# Patient Record
Sex: Female | Born: 1975 | Race: White | Hispanic: No | Smoking: Never smoker
Health system: Southern US, Community
[De-identification: ages and names within clinical notes are randomized; demographics above are authoritative.]

## PROBLEM LIST (undated history)

## (undated) DIAGNOSIS — F329 Major depressive disorder, single episode, unspecified: Secondary | ICD-10-CM

## (undated) DIAGNOSIS — Z8489 Family history of other specified conditions: Secondary | ICD-10-CM

## (undated) DIAGNOSIS — F32A Depression, unspecified: Secondary | ICD-10-CM

## (undated) DIAGNOSIS — F419 Anxiety disorder, unspecified: Secondary | ICD-10-CM

## (undated) DIAGNOSIS — K5792 Diverticulitis of intestine, part unspecified, without perforation or abscess without bleeding: Secondary | ICD-10-CM

## (undated) DIAGNOSIS — Z789 Other specified health status: Secondary | ICD-10-CM

## (undated) HISTORY — PX: OTHER SURGICAL HISTORY: SHX169

## (undated) HISTORY — DX: Other specified health status: Z78.9

---

## 2004-03-13 HISTORY — PX: BREAST ENHANCEMENT SURGERY: SHX7

## 2005-05-19 ENCOUNTER — Other Ambulatory Visit: Admission: RE | Admit: 2005-05-19 | Discharge: 2005-05-19 | Payer: Self-pay | Admitting: Family Medicine

## 2007-03-13 ENCOUNTER — Other Ambulatory Visit: Admission: RE | Admit: 2007-03-13 | Discharge: 2007-03-13 | Payer: Self-pay | Admitting: Family Medicine

## 2008-03-18 ENCOUNTER — Other Ambulatory Visit: Admission: RE | Admit: 2008-03-18 | Discharge: 2008-03-18 | Payer: Self-pay | Admitting: Family Medicine

## 2008-08-24 ENCOUNTER — Emergency Department (HOSPITAL_BASED_OUTPATIENT_CLINIC_OR_DEPARTMENT_OTHER): Admission: EM | Admit: 2008-08-24 | Discharge: 2008-08-24 | Payer: Self-pay | Admitting: Emergency Medicine

## 2008-08-27 ENCOUNTER — Encounter: Admission: RE | Admit: 2008-08-27 | Discharge: 2008-08-27 | Payer: Self-pay | Admitting: Family Medicine

## 2009-05-17 ENCOUNTER — Other Ambulatory Visit: Admission: RE | Admit: 2009-05-17 | Discharge: 2009-05-17 | Payer: Self-pay | Admitting: Family Medicine

## 2010-06-20 LAB — URINALYSIS, ROUTINE W REFLEX MICROSCOPIC
Bilirubin Urine: NEGATIVE
Glucose, UA: NEGATIVE mg/dL
Ketones, ur: 15 mg/dL — AB
pH: 6 (ref 5.0–8.0)

## 2010-06-20 LAB — URINE CULTURE: Colony Count: >=100000

## 2010-06-20 LAB — URINE MICROSCOPIC-ADD ON

## 2010-11-21 ENCOUNTER — Ambulatory Visit (INDEPENDENT_AMBULATORY_CARE_PROVIDER_SITE_OTHER): Payer: BC Managed Care – PPO | Admitting: *Deleted

## 2010-11-21 ENCOUNTER — Encounter: Payer: Self-pay | Admitting: *Deleted

## 2010-11-21 VITALS — BP 119/76 | Wt 140.0 lb

## 2010-11-21 DIAGNOSIS — Z3480 Encounter for supervision of other normal pregnancy, unspecified trimester: Secondary | ICD-10-CM

## 2010-11-21 DIAGNOSIS — O3680X0 Pregnancy with inconclusive fetal viability, not applicable or unspecified: Secondary | ICD-10-CM

## 2010-11-21 NOTE — Progress Notes (Signed)
Patient weened off of Zoloft 25mg , has not taken in two weeks on advice from her primary care.  Positive fetal heart rate seen on ultrasound, will set up ultrasound at [facility] to confirm her dating.  Information given about first trimester screening and patient will talk with her husband to see if this is something they would like to do.  She would like to have a v-bac although she has had two previous c-sections.  She would like her ultrasound appointment around 1:00 if at all possible.

## 2010-11-22 LAB — HIV ANTIBODY (ROUTINE TESTING W REFLEX): HIV: NONREACTIVE

## 2010-11-23 LAB — OBSTETRIC PANEL
Antibody Screen: NEGATIVE
Basophils Absolute: 0 10*3/uL (ref 0.0–0.1)
Eosinophils Relative: 0 % (ref 0–5)
HCT: 42.2 % (ref 36.0–46.0)
Lymphocytes Relative: 24 % (ref 12–46)
Lymphs Abs: 1.6 10*3/uL (ref 0.7–4.0)
MCH: 30.3 pg (ref 26.0–34.0)
MCV: 92.1 fL (ref 78.0–100.0)
Monocytes Absolute: 0.4 10*3/uL (ref 0.1–1.0)
RDW: 14.4 % (ref 11.5–15.5)
Rubella: 411 IU/mL — ABNORMAL HIGH
WBC: 6.8 10*3/uL (ref 4.0–10.5)

## 2010-11-28 ENCOUNTER — Ambulatory Visit (HOSPITAL_COMMUNITY)
Admission: RE | Admit: 2010-11-28 | Discharge: 2010-11-28 | Disposition: A | Discharge status: Home / Self Care | Payer: BC Managed Care – PPO | Source: Ambulatory Visit | Attending: Family Medicine | Admitting: Family Medicine

## 2010-11-28 DIAGNOSIS — O3680X0 Pregnancy with inconclusive fetal viability, not applicable or unspecified: Secondary | ICD-10-CM

## 2010-11-28 DIAGNOSIS — Z3689 Encounter for other specified antenatal screening: Secondary | ICD-10-CM | POA: Insufficient documentation

## 2010-11-28 DIAGNOSIS — O09529 Supervision of elderly multigravida, unspecified trimester: Secondary | ICD-10-CM | POA: Insufficient documentation

## 2010-11-28 DIAGNOSIS — O34219 Maternal care for unspecified type scar from previous cesarean delivery: Secondary | ICD-10-CM | POA: Insufficient documentation

## 2010-12-06 ENCOUNTER — Encounter: Payer: Self-pay | Admitting: Family Medicine

## 2010-12-13 ENCOUNTER — Encounter: Payer: Self-pay | Admitting: Family Medicine

## 2010-12-13 ENCOUNTER — Ambulatory Visit (INDEPENDENT_AMBULATORY_CARE_PROVIDER_SITE_OTHER): Payer: BC Managed Care – PPO | Admitting: Family Medicine

## 2010-12-13 DIAGNOSIS — Z3480 Encounter for supervision of other normal pregnancy, unspecified trimester: Secondary | ICD-10-CM

## 2010-12-13 DIAGNOSIS — O09529 Supervision of elderly multigravida, unspecified trimester: Secondary | ICD-10-CM

## 2010-12-13 DIAGNOSIS — Z113 Encounter for screening for infections with a predominantly sexual mode of transmission: Secondary | ICD-10-CM

## 2010-12-13 DIAGNOSIS — O360990 Maternal care for other rhesus isoimmunization, unspecified trimester, not applicable or unspecified: Secondary | ICD-10-CM

## 2010-12-13 DIAGNOSIS — Z1272 Encounter for screening for malignant neoplasm of vagina: Secondary | ICD-10-CM

## 2010-12-13 DIAGNOSIS — O34219 Maternal care for unspecified type scar from previous cesarean delivery: Secondary | ICD-10-CM | POA: Insufficient documentation

## 2010-12-13 NOTE — Progress Notes (Signed)
   Subjective:    Renee Hood is a [id number] [redacted]w[redacted]d being seen today for her first obstetrical visit.  Her obstetrical history is significant for advanced maternal age. Patient is unsure if she intend to breast feed. Pregnancy history fully reviewed.  Patient reports no complaints.  Filed Vitals:   12/13/10 1300  BP: 112/73  Weight: 142 lb (64.411 kg)    HISTORY: OB History    Grav Para Term Preterm Abortions TAB SAB Ect Mult Living   4 2 2  0 1 0 1 0 0 2     # Outc Date GA Lbr Len/2nd Wgt Sex Del Anes PTL Lv   1 TRM 3/01 [redacted]w[redacted]d  7lb14oz(3.572kg) M LTCS Spinal No Yes   Comments: failed induction after 24 hours labor emergency c-section   2 TRM 7/02 [redacted]w[redacted]d  7lb15oz(3.6kg) F LTCS Spinal No Yes   3 SAB            4 CUR              Past Medical History  Diagnosis Date  . No pertinent past medical history    Past Surgical History  Procedure Date  . Cesarean section   . Dislocated hip 21 months    surgery to repair/tn  . Breast enhancement surgery 2006   Family History  Problem Relation Age of Onset  . Diabetes Maternal Grandmother   . Cancer Paternal Grandmother     colon cancer/under age 84     Exam    Uterine Size: size equals dates  Pelvic Exam:    Perineum: No Hemorrhoids   Vulva: normal   Vagina:  normal mucosa, normal discharge       Cervix: retroverted and non-tender   Adnexa: normal adnexa   Bony Pelvis: gynecoid  System:     Skin: normal coloration and turgor, no rashes    Neurologic: oriented   Extremities: normal strength, tone, and muscle mass   HEENT sclera clear, anicteric   Mouth/Teeth mucous membranes moist, pharynx normal without lesions   Neck supple   Cardiovascular: regular rate and rhythm   Respiratory:  appears well, vitals normal, no respiratory distress, acyanotic, normal RR, ear and throat exam is normal, neck free of mass or lymphadenopathy, chest clear, no wheezing, crepitations, rhonchi, normal symmetric air entry   Abdomen: soft,  non-tender; bowel sounds normal; no masses,  no organomegaly   Urinary: urethral meatus normal      Assessment:    Pregnancy: [id number] Patient Active Problem List  Diagnoses  . Previous cesarean delivery, antepartum condition or complication  . Rh negative, antepartum  . AMA (advanced maternal age) multigravida 35+        Plan:     Initial labs drawn. Prenatal vitamins. Problem list reviewed and updated. Genetic Screening discussed Amniocentesis: undecided.  Ultrasound discussed; fetal survey: later.  Follow up in 4 weeks. Discussed TOLAC at length. Asja Frommer S 12/13/2010

## 2010-12-13 NOTE — Patient Instructions (Addendum)
Amniocentesis Amniocentesis is the removal of fluid from the amniotic sac. This is the sac of fluid that surrounds and protects the baby while it is in the uterus. Once the fluid is removed, your caregiver can have the fluid studied. These studies help find problems the fetus may be having before delivery. Amniocentesis may be done for other reasons, including:  To determine any genetic problems.   To look for signs of infection in the uterus.   To determine if the baby's lungs are mature enough for the baby to survive outside of the uterus. This information is important in pregnancies where the fetus must be delivered early.   To determine the sex of the baby.   If you are pregnant and 65 years old or older. This is because of the increased risk of chromosome abnormalities.   To evaluate the progress of the babies in a multiple pregnancy (twins or more).  BEFORE THE PROCEDURE You should arrive 15 before your procedure, or as suggested by your caregiver. Check-in at the admissions desk to fill out necessary forms, if you are not preregistered. There will be consent forms to sign before the procedure. There is a waiting area for your family. LET YOUR CAREGIVER KNOW ABOUT THE FOLLOWING:  Allergies.  Medications taken, including herbs, eyedrops, over-the-counter medications, and creams.   Use of steroids (by mouth or creams).   Previous problems with numbing medicines.   History of blood clots.  History of bleeding or blood problems.   Previous surgery.   Other health problems.   RISKS AND COMPLICATIONS There is a 0.7% fetal loss (death) rate from complications, when done between 15 and 20 weeks of pregnancy or later. Amniocentesis should not be performed before 15 weeks of pregnancy unless it is absolutely necessary. Removal of the amniotic fluid is often done using ultrasound guidance, to decrease risks to the baby. Complications include:  Bleeding.  Infection.   Leaking of  amniotic fluid.   Premature labor.  Fetal injury.   Injury to the placenta.   This procedure is done only if your caregiver feels the information obtained from the procedure justifies the risk. If there is information about this procedure you do not understand, discuss this with your caregiver. AFTER THE PROCEDURE  You will rest for 1 to 2 hours for observation, at the place where the amniocentesis was done.   The baby will be placed on a monitor for 1 to 2 hours for observation, to see if there are any problems.   You should have someone available to take you home and be with your for that day.  FINDING OUT THE RESULTS OF YOUR TEST Not all test results are available during your visit. If your test results are not back during the visit, make an appointment with your caregiver to find out the results. Do not assume everything is normal if you have not heard from your caregiver or the medical facility. It is important for you to follow up on all of your test results.  HOME CARE INSTRUCTIONS  Take any medication your caregiver gives you.   Rest and eliminate all activities for that day.   Eat your regular diet.   Drink lots of fluids.   Avoid sexual intercourse until your caregiver says it is okay.   Do not do any lifting for the rest of the day.  SEEK MEDICAL CARE IF:  You develop any vaginal bleeding.   You develop leakage of fluid from the vagina.  You develop pain in the belly (abdomen).   You develop uterine contractions.   You have an oral temperature above 101.   You do not feel the baby moving as much as it usually does.  Document Released: 02/25/2000 Document Re-Released: 08/17/2009 [facility] Patient Information 2011 [place], [place]. Pregnancy - First Trimester During sexual intercourse, millions of sperm go into the vagina. Only 1 sperm will penetrate and fertilize the female egg while it is in the Fallopian tube. One week later, the fertilized egg implants into  the wall of the uterus. An embryo begins to develop into a baby. At 6 to 8 weeks, the eyes and face are formed and the heartbeat can be seen on ultrasound. At the end of 12 weeks (first trimester), all the baby's organs are formed. Now that you are pregnant, you will want to do everything you can to have a healthy baby. Two of the most important things are to get good prenatal care and follow your caregiver's instructions. Prenatal care is all the medical care you receive before the baby's birth. It is given to prevent, find and treat problems during the pregnancy and childbirth. PRENATAL EXAMS:  During prenatal visits, your weight, blood pressure and urine are checked. This is done to make sure you are healthy and progressing normally during the pregnancy.   A pregnant woman should gain 25 to 35 pounds during the pregnancy. However, if you are over weight or underweight, your caregiver will advise you regarding your weight.   Your caregiver will ask and answer questions for you.   Blood work, cervical cultures, other necessary tests and a Pap test are done during your prenatal exams. These tests are done to check on your health and the probable health of your baby. Tests are strongly recommended and done for HIV with your permission. This is the virus that causes AIDS. These tests are done because medications can be given to help prevent your baby from being born with this infection should you have been infected without knowing it. Blood work is also used to find out your blood type, previous infections and follow your blood levels (hemoglobin).   Low hemoglobin (anemia) is common during pregnancy. Iron and vitamins are given to help prevent this. Later in the pregnancy, blood tests for diabetes will be done along with any other tests if any problems develop. You may need tests to make sure you and the baby are doing well.   You may need other tests to make sure you and the baby are doing well.    CHANGES DURING THE FIRST TRIMESTER (THE FIRST 3 MONTHS OF PREGNANCY) Your body goes through many changes during pregnancy. They vary from person to person. Talk to your caregiver about changes you notice and are concerned about. Changes can include:  Your menstrual period stops.   The egg and sperm carry the genes that determine what you look like. Genes from you and your partner are forming a baby. The female genes determine whether the baby is a boy or a girl.   Your body increases in girth and you may feel bloated.   Feeling sick to your stomach (nauseous) and throwing up (vomiting). If the vomiting is uncontrollable, call your caregiver.   Your breasts will begin to enlarge and become tender.   Your nipples may stick out more and become darker.   The need to urinate more. Painful urination may mean you have a bladder infection.   Tiring easily.   Loss  of appetite.   Cravings for certain kinds of food.   At first, you may gain or lose a couple of pounds.   You may have changes in your emotions from day to day (excited to be pregnant or concerned something may go wrong with the pregnancy and baby).   You may have more vivid and strange dreams.  HOME CARE INSTRUCTIONS  It is very important to avoid all smoking, alcohol and un-prescribed drugs during your pregnancy. These affect the formation and growth of the baby. Avoid chemicals while pregnant to ensure the delivery of a healthy infant.   Start your prenatal visits by the 12th week of pregnancy. They are usually scheduled monthly at first, then more often in the last 2 months before delivery. Keep your caregiver's appointments. Follow your caregiver's instructions regarding medication use, blood and lab tests, exercise, and diet.   During pregnancy, you are providing food for you and your baby. Eat regular, well-balanced meals. Choose foods such as meat, fish, milk and other low fat dairy products, vegetables, fruits, and  whole-grain breads and cereals. Your caregiver will tell you of the ideal weight gain.   You can help morning sickness by keeping soda crackers (saltines) at the bedside. Eat a couple before arising in the morning. You may want to use the crackers without salt on them.   Eating 4 to 5 small meals rather than 3 large meals a day also may help the nausea and vomiting.   Drinking liquids between meals instead of during meals also seems to help nausea and vomiting.   A physical sexual relationship may be continued throughout pregnancy if there are no other problems. Problems may be early (premature) leaking of amniotic fluid from the membranes, vaginal bleeding, or belly (abdominal) pain.   Exercise regularly if there are no restrictions. Check with your caregiver or physical therapist if you are unsure of the safety of some of your exercises. Greater weight gain will occur in the last 2 trimesters of pregnancy. Exercising will help:   Control your weight.   Keep you in shape.   Prepare you for labor and delivery.   Help you lose your pregnancy weight after you deliver your baby.   Wear a good support or jogging bra for breast tenderness during pregnancy. This may help if worn during sleep too.   Ask when prenatal classes are available. Begin classes when they are offered.   Do not use hot tubs, steam rooms or saunas.   Wear your seat belt when driving. This protects you and your baby if you are in an accident.   Avoid raw meat, uncooked cheese, cat litter boxes and soil used by cats throughout the pregnancy. These carry germs that can cause birth defects in the baby.   The first trimester is a good time to visit your dentist for your dental health. Getting your teeth cleaned is OK. Use a softer toothbrush and brush gently during pregnancy.   Ask for help if you have financial, counseling or nutritional needs during pregnancy. Your caregiver will be able to offer counseling for these  needs as well as refer you for other special needs.   Do not take any medications or herbs unless told by your caregiver.   Inform your caregiver if there is any mental or physical domestic violence.   Make a list of emergency phone numbers of family, friends, hospital, police and fire department.   Write down your questions. Take them to your prenatal  visit.   Do not douche.   Do not cross your legs.   If you have to stand for long periods of time, rotate you feet or take small steps in a circle.   You may have more vaginal secretions that may require a sanitary pad. Do not use tampons or scented sanitary pads.  MEDICATIONS AND DRUG USE IN PREGNANCY  Take prenatal vitamins as directed. The vitamin should contain 1 milligram of folic acid. Keep all vitamins out of reach of children. Only a couple vitamins or tablets containing iron may be fatal to a baby or young child when ingested.   Avoid use of all medications, including herbs, over-the-counter medications, not prescribed or suggested by your caregiver. Only take over-the-counter or prescription medicines for pain, discomfort, or fever as directed by your caregiver. Do not use aspirin, ibuprofen (Motrin, Advil, Nuprin) or naproxen (Aleve) unless OK'd by your caregiver.   Let your caregiver also know about herbs you may be using.   Alcohol is related to a number of birth defects. This includes fetal alcohol syndrome. All alcohol, in any form, should be avoided completely. Smoking will cause low birth rate and premature babies.   Street/illegal drugs are very harmful to the baby. They are absolutely forbidden. A baby born to an addicted mother will be addicted at birth. The baby will go through the same withdrawal an adult does.   Let your caregiver know about any medications that you have to take and for what reason you take them.  MISCARRIAGE IS COMMON DURING PREGNANCY A miscarriage does not mean you did something wrong. It is  not a reason to worry about getting pregnant again. Your caregiver will help you with questions you may have. If you have a miscarriage, you may need minor surgery (a D & C). SEEK MEDICAL CARE IF:  You have any concerns or worries during your pregnancy. It is better to call with your questions if you feel they cannot wait, rather than worry about them.  SEEK IMMEDIATE MEDICAL CARE IF:  An unexplained oral temperature above 101 develops, or as your caregiver suggests.   You have leaking of fluid from the vagina (birth canal). If leaking membranes are suspected, take your temperature and inform your caregiver of this when you call.   There is vaginal spotting or bleeding. Notify your caregiver of the amount and how many pads are used.   You develop a bad smelling vaginal discharge with a change in the color.   You continue to feel sick to your stomach (nauseated) and have no relief from remedies suggested. You vomit blood or coffee ground like materials.   You lose more than 2 pounds of weight in one week.   You gain more than 2 pounds of weight in a week and you notice swelling of your face, hands, feet or legs.   You gain 5 pounds or more in 1 week (even if you do not have swelling of your hands, face, legs or feet).   You get exposed to [place] measles and have never had them.   You are exposed to fifth disease or chicken pox.   You develop belly (abdominal) pain. Round ligament discomfort is a common non-cancerous (benign) cause of abdominal pain in pregnancy. Your caregiver still must evaluate this.   You develop headache, fever, diarrhea, pain with urination, or shortness of breath.   You fall, are in a car accident or have any kind of trauma.   There is  mental or physical violence in your home.  Document Released: 02/21/2001 Document Re-Released: 08/17/2009 [facility] Patient Information 2011 [place], [place].

## 2010-12-26 ENCOUNTER — Other Ambulatory Visit (HOSPITAL_COMMUNITY): Payer: BC Managed Care – PPO

## 2010-12-27 ENCOUNTER — Ambulatory Visit (HOSPITAL_COMMUNITY)
Admission: RE | Admit: 2010-12-27 | Discharge: 2010-12-27 | Disposition: A | Discharge status: Home / Self Care | Payer: BC Managed Care – PPO | Source: Ambulatory Visit | Attending: Family Medicine | Admitting: Family Medicine

## 2010-12-27 ENCOUNTER — Other Ambulatory Visit: Payer: Self-pay | Admitting: Family Medicine

## 2010-12-27 ENCOUNTER — Ambulatory Visit (HOSPITAL_COMMUNITY): Admission: RE | Admit: 2010-12-27 | Payer: BC Managed Care – PPO | Source: Ambulatory Visit

## 2010-12-27 ENCOUNTER — Other Ambulatory Visit: Payer: Self-pay | Admitting: Obstetrics & Gynecology

## 2010-12-27 DIAGNOSIS — O09529 Supervision of elderly multigravida, unspecified trimester: Secondary | ICD-10-CM

## 2010-12-27 DIAGNOSIS — O021 Missed abortion: Secondary | ICD-10-CM | POA: Insufficient documentation

## 2010-12-27 DIAGNOSIS — O34219 Maternal care for unspecified type scar from previous cesarean delivery: Secondary | ICD-10-CM | POA: Insufficient documentation

## 2010-12-27 MED ORDER — DOXYCYCLINE HYCLATE 100 MG IV SOLR
100.0000 mg | INTRAVENOUS | Status: AC
  Administered 2010-12-28: 100 mg via INTRAVENOUS
  Filled 2010-12-27: qty 100

## 2010-12-27 NOTE — Progress Notes (Signed)
Encounter addended by: Marlana Latus, RN on: 12/27/2010  5:22 PM<BR>     Documentation filed: Episodes, Chief Complaint Section

## 2010-12-27 NOTE — Progress Notes (Signed)
Obstetric ultrasound performed today.  Missed AB diagnosed.  See report in [facility].

## 2010-12-27 NOTE — Progress Notes (Signed)
Patient was seen in MFM today for a nuchal translucency scan and found to have a missed abortion. Patient informed of diagnosis and she was appropriately sad.  Appropriate support was given to the patient.  She was informed of need for D&E for management and she wanted to do this as soon as possible.  Procedure was scheduled for 2 pm on 12/28/10.  Routine preoperative instructions of having nothing to eat or drink after midnight and also coming to the hospital 1 1/2 hours prior to her time of surgery were also emphasized.  She was told she should call clinic or go to the [facility] for any concerns prior to surgery.  Davarius Ridener A 12/27/2010 12:29 PM

## 2010-12-28 ENCOUNTER — Ambulatory Visit (HOSPITAL_COMMUNITY)
Admission: RE | Admit: 2010-12-28 | Discharge: 2010-12-28 | Disposition: A | Discharge status: Home / Self Care | Payer: BC Managed Care – PPO | Source: Ambulatory Visit | Attending: Obstetrics & Gynecology | Admitting: Obstetrics & Gynecology

## 2010-12-28 ENCOUNTER — Encounter (HOSPITAL_COMMUNITY): Payer: Self-pay | Admitting: Anesthesiology

## 2010-12-28 ENCOUNTER — Encounter (HOSPITAL_COMMUNITY)
Admission: RE | Disposition: A | Discharge status: Home / Self Care | Payer: Self-pay | Source: Ambulatory Visit | Attending: Obstetrics & Gynecology

## 2010-12-28 ENCOUNTER — Other Ambulatory Visit: Payer: Self-pay | Admitting: Obstetrics & Gynecology

## 2010-12-28 ENCOUNTER — Ambulatory Visit (HOSPITAL_COMMUNITY): Payer: BC Managed Care – PPO | Admitting: Anesthesiology

## 2010-12-28 DIAGNOSIS — O021 Missed abortion: Secondary | ICD-10-CM | POA: Insufficient documentation

## 2010-12-28 HISTORY — PX: DILATION AND EVACUATION: SHX1459

## 2010-12-28 LAB — TYPE AND SCREEN: Antibody Screen: NEGATIVE

## 2010-12-28 SURGERY — DILATION AND EVACUATION, UTERUS
Anesthesia: Monitor Anesthesia Care | Site: Vagina | Wound class: Clean Contaminated

## 2010-12-28 MED ORDER — DOCUSATE SODIUM 100 MG PO CAPS: 100.0000 mg | ORAL_CAPSULE | Freq: Two times a day (BID) | ORAL | Status: AC | PRN

## 2010-12-28 MED ORDER — PROPOFOL 10 MG/ML IV EMUL
INTRAVENOUS | Status: AC
  Filled 2010-12-28: qty 20

## 2010-12-28 MED ORDER — MIDAZOLAM HCL 5 MG/5ML IJ SOLN
INTRAMUSCULAR | Status: DC | PRN
  Administered 2010-12-28: 2 mg via INTRAVENOUS

## 2010-12-28 MED ORDER — LACTATED RINGERS IV SOLN
INTRAVENOUS | Status: DC
  Administered 2010-12-28 (×2): via INTRAVENOUS

## 2010-12-28 MED ORDER — DEXAMETHASONE SODIUM PHOSPHATE 10 MG/ML IJ SOLN
INTRAMUSCULAR | Status: AC
  Filled 2010-12-28: qty 1

## 2010-12-28 MED ORDER — DOXYCYCLINE HYCLATE 100 MG PO TABS
100.0000 mg | ORAL_TABLET | Freq: Once | ORAL | Status: AC
  Administered 2010-12-28: 100 mg via ORAL

## 2010-12-28 MED ORDER — MIDAZOLAM HCL 2 MG/2ML IJ SOLN
INTRAMUSCULAR | Status: AC
  Filled 2010-12-28: qty 2

## 2010-12-28 MED ORDER — KETOROLAC TROMETHAMINE 30 MG/ML IJ SOLN
INTRAMUSCULAR | Status: DC | PRN
  Administered 2010-12-28: 30 mg via INTRAVENOUS

## 2010-12-28 MED ORDER — FENTANYL CITRATE 0.05 MG/ML IJ SOLN
INTRAMUSCULAR | Status: AC
  Filled 2010-12-28: qty 5

## 2010-12-28 MED ORDER — ONDANSETRON HCL 4 MG/2ML IJ SOLN
INTRAMUSCULAR | Status: AC
  Filled 2010-12-28: qty 2

## 2010-12-28 MED ORDER — KETOROLAC TROMETHAMINE 30 MG/ML IJ SOLN
INTRAMUSCULAR | Status: AC
  Filled 2010-12-28: qty 1

## 2010-12-28 MED ORDER — OXYCODONE-ACETAMINOPHEN 5-325 MG PO TABS: 1.0000 | ORAL_TABLET | Freq: Four times a day (QID) | ORAL | Status: AC | PRN

## 2010-12-28 MED ORDER — LIDOCAINE HCL (CARDIAC) 20 MG/ML IV SOLN
INTRAVENOUS | Status: AC
  Filled 2010-12-28: qty 5

## 2010-12-28 MED ORDER — PROPOFOL 10 MG/ML IV EMUL
INTRAVENOUS | Status: DC | PRN
  Administered 2010-12-28: 20 mg via INTRAVENOUS
  Administered 2010-12-28 (×2): 30 mg via INTRAVENOUS

## 2010-12-28 MED ORDER — KETOROLAC TROMETHAMINE 30 MG/ML IJ SOLN: 15.0000 mg | Freq: Once | INTRAMUSCULAR | Status: DC | PRN

## 2010-12-28 MED ORDER — BUPIVACAINE-EPINEPHRINE 0.5% -1:200000 IJ SOLN
INTRAMUSCULAR | Status: DC | PRN
  Administered 2010-12-28: 30 mL

## 2010-12-28 MED ORDER — DOXYCYCLINE HYCLATE 100 MG PO TABS
ORAL_TABLET | ORAL | Status: AC
  Administered 2010-12-28: 100 mg via ORAL
  Filled 2010-12-28: qty 1

## 2010-12-28 MED ORDER — ONDANSETRON HCL 4 MG/2ML IJ SOLN
INTRAMUSCULAR | Status: DC | PRN
  Administered 2010-12-28: 4 mg via INTRAVENOUS

## 2010-12-28 MED ORDER — FENTANYL CITRATE 0.05 MG/ML IJ SOLN
INTRAMUSCULAR | Status: DC | PRN
  Administered 2010-12-28 (×3): 50 ug via INTRAVENOUS

## 2010-12-28 MED ORDER — IBUPROFEN 600 MG PO TABS: 600.0000 mg | ORAL_TABLET | Freq: Four times a day (QID) | ORAL | Status: AC | PRN

## 2010-12-28 MED ORDER — FENTANYL CITRATE 0.05 MG/ML IJ SOLN: 25.0000 ug | INTRAMUSCULAR | Status: DC | PRN

## 2010-12-28 MED ORDER — LIDOCAINE HCL (CARDIAC) 20 MG/ML IV SOLN
INTRAVENOUS | Status: DC | PRN
  Administered 2010-12-28: 50 mg via INTRAVENOUS
  Administered 2010-12-28: 20 mg via INTRAVENOUS

## 2010-12-28 MED ORDER — LACTATED RINGERS IV SOLN: INTRAVENOUS | Status: DC | PRN

## 2010-12-28 SURGICAL SUPPLY — 23 items
CATH ROBINSON RED A/P 16FR (CATHETERS) ×2 IMPLANT
CLOTH BEACON ORANGE TIMEOUT ST (SAFETY) ×2 IMPLANT
DECANTER SPIKE VIAL GLASS SM (MISCELLANEOUS) ×2 IMPLANT
DRAPE UTILITY XL STRL (DRAPES) ×2 IMPLANT
GLOVE BIO SURGEON STRL SZ7 (GLOVE) ×2 IMPLANT
GLOVE BIOGEL PI IND STRL 7.0 (GLOVE) ×2 IMPLANT
GLOVE BIOGEL PI INDICATOR 7.0 (GLOVE) ×2
GOWN PREVENTION PLUS LG XLONG (DISPOSABLE) ×2 IMPLANT
GOWN STRL REIN XL XLG (GOWN DISPOSABLE) ×2 IMPLANT
KIT BERKELEY 1ST TRIMESTER 3/8 (MISCELLANEOUS) ×2 IMPLANT
NDL SPNL 22GX3.5 QUINCKE BK (NEEDLE) ×1 IMPLANT
NEEDLE SPNL 22GX3.5 QUINCKE BK (NEEDLE) ×2 IMPLANT
NS IRRIG 1000ML POUR BTL (IV SOLUTION) ×2 IMPLANT
PACK VAGINAL MINOR WOMEN LF (CUSTOM PROCEDURE TRAY) ×2 IMPLANT
PAD PREP 24X48 CUFFED NSTRL (MISCELLANEOUS) ×2 IMPLANT
SET BERKELEY SUCTION TUBING (SUCTIONS) ×2 IMPLANT
SYR CONTROL 10ML LL (SYRINGE) ×2 IMPLANT
TOWEL OR 17X24 6PK STRL BLUE (TOWEL DISPOSABLE) ×4 IMPLANT
VACURETTE 10 RIGID CVD (CANNULA) IMPLANT
VACURETTE 12 RIGID CVD (CANNULA) ×1 IMPLANT
VACURETTE 7MM CVD STRL WRAP (CANNULA) IMPLANT
VACURETTE 8 RIGID CVD (CANNULA) IMPLANT
VACURETTE 9 RIGID CVD (CANNULA) IMPLANT

## 2010-12-28 NOTE — H&P (Signed)
Preoperative History and Physical  Renee Hood is a 35 y.o. [phone] with Patient's last menstrual period was 09/29/2010. here for surgical management of missed abortion at [redacted] weeks GA.   Proposed surgery: Dilation and Evacuation  PMH:    Past Medical History  Diagnosis Date  . No pertinent past medical history    PSH:     Past Surgical History  Procedure Date  . Cesarean section   . Dislocated hip 21 months    surgery to repair/tn  . Breast enhancement surgery 2006   POb/GynH:   OB History    Grav Para Term Preterm Abortions TAB SAB Ect Mult Living   4 2 2  0 1 0 1 0 0 2    Patient's last menstrual period was 09/29/2010.   SH:   History  Substance Use Topics  . Smoking status: Never Smoker   . Smokeless tobacco: Never Used  . Alcohol Use: No   FH:    Family History  Problem Relation Age of Onset  . Diabetes Maternal Grandmother   . Cancer Paternal Grandmother     colon cancer/under age 6   Allergies: No Known Allergies  Medications:      Current facility-administered medications:doxycycline (VIBRAMYCIN) 100 mg in dextrose 5 % 250 mL IVPB, 100 mg, Intravenous, On Call to OR, Shawan Tosh A Herron Fero, MD;  lactated ringers infusion, , Intravenous, Continuous, Eulalia Ellerman A Willamae Demby, MD  PHYSICAL EXAM: Blood pressure 113/81, pulse 75, temperature 98.6 F (37 C), temperature source Oral, resp. rate 16, height 5' 3.5" (1.613 m), weight 63.957 kg (141 lb), last menstrual period 09/29/2010, SpO2 100.00%. General: General appearance - alert, well appearing, and in no distress Chest - clear to auscultation, no wheezes, rales or rhonchi, symmetric air entry Heart - normal rate and regular rhythm Abdomen - soft, nontender, nondistended, no masses or organomegaly Pelvic - examination not indicated Extremities - peripheral pulses normal, no pedal edema, no clubbing or cyanosis  Labs: No results found for this or any previous visit (from the past 336 hour(s)).  Imaging Studies: 11/28/2010   OBSTETRICAL ULTRASOUND:Missed abortion at [redacted] weeks GA  Assessment: Patient Active Problem List  Diagnoses  . Previous cesarean delivery, antepartum condition or complication  . Rh negative, antepartum  . AMA (advanced maternal age) multigravida 35+  . Missed abortion    Plan: Patient is to undergo a Dilation and Evacuation procedure.  Risks of surgery including bleeding, infection, injury to surrounding organs, need for additional procedures, possibility of intrauterine scarring which may impair future fertility, risk of retained products which may require further management and other postoperative/anesthesia complications were explained to patient and written informed consent was obtained.  Likelihood of success of complete evacuation of the uterus was discussed with the patient.  Preoperative prophylactic Doxycycline 100mg  IV  has been ordered and is on call to the OR.  To OR when ready.    Takeela Peil A 12/28/2010 1:13 PM

## 2010-12-28 NOTE — Anesthesia Preprocedure Evaluation (Signed)
Anesthesia Evaluation  Name, MR# and DOB Patient awake  General Assessment Comment  Reviewed: Allergy & Precautions, H&P , NPO status , Patient's Chart, lab work & pertinent test results, reviewed documented beta blocker date and time   History of Anesthesia Complications Negative for: history of anesthetic complications  Airway Mallampati: I TM Distance: >3 FB Neck ROM: full    Dental  (+) Teeth Intact   Pulmonary  clear to auscultation        Cardiovascular regular Normal    Neuro/Psych Negative Neurological ROS  Negative Psych ROS   GI/Hepatic negative GI ROS Neg liver ROS    Endo/Other  Negative Endocrine ROS  Renal/GU negative Renal ROS     Musculoskeletal   Abdominal   Peds  Hematology negative hematology ROS (+)   Anesthesia Other Findings   Reproductive/Obstetrics (+) Pregnancy (missed ab at 12 weeks)                           Anesthesia Physical Anesthesia Plan  ASA: II  Anesthesia Plan: MAC   Post-op Pain Management:    Induction:   Airway Management Planned:   Additional Equipment:   Intra-op Plan:   Post-operative Plan:   Informed Consent: I have reviewed the patients History and Physical, chart, labs and discussed the procedure including the risks, benefits and alternatives for the proposed anesthesia with the patient or authorized representative who has indicated his/her understanding and acceptance.     Plan Discussed with: CRNA and Surgeon  Anesthesia Plan Comments:         Anesthesia Quick Evaluation

## 2010-12-28 NOTE — Anesthesia Postprocedure Evaluation (Signed)
Anesthesia Post Note  Patient: Renee Hood  Procedure(s) Performed:  DILATATION AND EVACUATION (D&E)  Anesthesia type: MAC  Patient location: PACU  Post pain: Pain level controlled  Post assessment: Post-op Vital signs reviewed  Last Vitals:  Filed Vitals:   12/28/10 1610  BP:   Pulse:   Temp: 98 F (36.7 C)  Resp:     Post vital signs: Reviewed  Level of consciousness: sedated  Complications: No apparent anesthesia complications

## 2010-12-28 NOTE — Op Note (Signed)
Paulino Door PROCEDURE DATE: 12/28/2010  PREOPERATIVE DIAGNOSIS: 12 week missed abortion. POSTOPERATIVE DIAGNOSIS: The same. PROCEDURE:   Dilation and Evacuation. SURGEON:  Dr. Jaynie Collins  INDICATIONS: 35 y.o. G4P2012with MAB at [redacted] weeks gestation, needing surgical completion.  Risks of surgery were discussed with the patient including but not limited to: bleeding which may require transfusion; infection which may require antibiotics; injury to uterus or surrounding organs; need for additional procedures including laparotomy or laparoscopy; possibility of intrauterine scarring which may impair future fertility; risk of retained products which may need further management and other postoperative/anesthesia complications. Written informed consent was obtained.    FINDINGS:  A 12 week size uterus, moderate amounts of products of conception, specimen sent to pathology.  ANESTHESIA:    Monitored intravenous sedation, paracervical block. INTRAVENOUS FLUIDS:  700 ml of LR ESTIMATED BLOOD LOSS:  Less than 20 ml. SPECIMENS:  Products of conception sent to pathology COMPLICATIONS:  None immediate.  PROCEDURE DETAILS:  The patient received intravenous antibiotics while in the preoperative area.  She was then taken to the operating room where general anesthesia was administered and was found to be adequate.  After an adequate timeout was performed, she was placed in the dorsal lithotomy position and examined; then prepped and draped in the sterile manner.   Her bladder was catheterized for an unmeasured amount of clear, yellow urine. A vaginal speculum was then placed in the patient's vagina and a single tooth tenaculum was applied to the anterior lip of the cervix.  A paracervical block using 0.5% Marcaine with epinephrinewas administered. The cervix was gently dilated to accommodate a 12 mm suction curette that was gently advanced to the uterine fundus.  The suction device was then activated and curette  slowly rotated to clear the uterus of products of conception.  A sharp curettage was then performed to confirm completer emptying of the uterus.There was minimal bleeding noted and the tenaculum removed with good hemostasis noted.  The patient tolerated the procedure well.  The patient was taken to the recovery area in stable condition.  The patient will be discharged to home as per PACU criteria.  Routine postoperative instructions given.  She was prescribed Percocet, Ibuprofen and Colace.  She will follow up in the clinic on 01/09/11 for postoperative evaluation .

## 2010-12-28 NOTE — Transfer of Care (Signed)
Immediate Anesthesia Transfer of Care Note  Patient: Renee Hood  Procedure(s) Performed:  DILATATION AND EVACUATION (D&E)  Patient Location: PACU  Anesthesia Type: MAC  Level of Consciousness: awake, alert  and oriented  Airway & Oxygen Therapy: Patient Spontanous Breathing  Post-op Assessment: Report given to PACU RN and Post -op Vital signs reviewed and stable  Post vital signs: Reviewed and stable  Complications: No apparent anesthesia complications

## 2010-12-30 ENCOUNTER — Encounter (HOSPITAL_COMMUNITY): Payer: Self-pay | Admitting: Obstetrics & Gynecology

## 2011-01-02 ENCOUNTER — Encounter: Payer: BC Managed Care – PPO | Admitting: Obstetrics & Gynecology

## 2011-01-02 ENCOUNTER — Encounter: Payer: Self-pay | Admitting: Obstetrics & Gynecology

## 2011-01-02 ENCOUNTER — Ambulatory Visit (INDEPENDENT_AMBULATORY_CARE_PROVIDER_SITE_OTHER): Payer: BC Managed Care – PPO | Admitting: Obstetrics & Gynecology

## 2011-01-02 VITALS — BP 113/92 | HR 118 | Ht 63.5 in | Wt 138.0 lb

## 2011-01-02 DIAGNOSIS — G8918 Other acute postprocedural pain: Secondary | ICD-10-CM

## 2011-01-02 DIAGNOSIS — Z4889 Encounter for other specified surgical aftercare: Secondary | ICD-10-CM

## 2011-01-02 DIAGNOSIS — R109 Unspecified abdominal pain: Secondary | ICD-10-CM

## 2011-01-02 NOTE — Progress Notes (Signed)
  Subjective:     Renee Hood is a 35 y.o. female who presents to the clinic status post D& E on 12/28/10 for missed abortion at [redacted] weeks GA. She reports having significant abdominal/gastrointestinal pain that started the day after surgery.  Not associated with eating, no fevers, no vomiting but "it hurts too much to eat.  When I do eat. I have to run to the bathroom for loose stools". No blood in the stool; no vaginal bleeding.  Patient is really concerned about these symptoms and wants further evaluation. She denies any uterine cramping, GU symptoms or other symptoms.  The following portions of the patient's history were reviewed and updated as appropriate: allergies, current medications, past family history, past medical history, past social history, past surgical history and problem list.  Review of Systems Pertinent items are noted in HPI.    Objective:    BP 113/92  Pulse 118  Wt 138 lb (62.596 kg)  LMP 09/29/2010  Breastfeeding? Unknown General:  alert and mild distress  Abdomen: soft, bowel sounds active, no abnormal masses, no rebound or guarding.  No signs of acute abdomen. Pelvic exam is deferred.     Pathology: Products of conception; immature chorionic villi without hydatidiform change, fetal parts identified.  Assessment:    Postoperative course complicated by acute abdominal pain.    Plan:    1. Continue any current medications for pain; avoid NSAIDS as she not eating much and may aggravate GI distress. 2.  CT scan of abdomen and pelvis with contrast ordered for tomorrow morning.  Will follow up results and manage accordingly. 3.  Activity restrictions: routine 4.  Anticipated return to work: October 29 if she remains stable; letter provided. 5.  Follow up: 2 weeks or earlier as needed.  6.  Advised to call/go to the ER for any worsening symptoms.

## 2011-01-03 ENCOUNTER — Ambulatory Visit (HOSPITAL_COMMUNITY)
Admission: RE | Admit: 2011-01-03 | Discharge: 2011-01-03 | Disposition: A | Discharge status: Home / Self Care | Payer: BC Managed Care – PPO | Source: Ambulatory Visit | Attending: Obstetrics & Gynecology | Admitting: Obstetrics & Gynecology

## 2011-01-03 DIAGNOSIS — R109 Unspecified abdominal pain: Secondary | ICD-10-CM | POA: Insufficient documentation

## 2011-01-03 DIAGNOSIS — R197 Diarrhea, unspecified: Secondary | ICD-10-CM | POA: Insufficient documentation

## 2011-01-03 DIAGNOSIS — G8918 Other acute postprocedural pain: Secondary | ICD-10-CM

## 2011-01-03 MED ORDER — IOHEXOL 300 MG/ML  SOLN
100.0000 mL | Freq: Once | INTRAMUSCULAR | Status: AC | PRN
  Administered 2011-01-03: 100 mL via INTRAVENOUS

## 2011-01-09 ENCOUNTER — Ambulatory Visit (INDEPENDENT_AMBULATORY_CARE_PROVIDER_SITE_OTHER): Payer: BC Managed Care – PPO | Admitting: Obstetrics & Gynecology

## 2011-01-09 ENCOUNTER — Encounter: Payer: Self-pay | Admitting: Obstetrics & Gynecology

## 2011-01-09 VITALS — BP 121/86 | HR 78 | Wt 135.0 lb

## 2011-01-09 DIAGNOSIS — Z3009 Encounter for other general counseling and advice on contraception: Secondary | ICD-10-CM

## 2011-01-09 DIAGNOSIS — Z09 Encounter for follow-up examination after completed treatment for conditions other than malignant neoplasm: Secondary | ICD-10-CM

## 2011-01-09 NOTE — Patient Instructions (Signed)

## 2011-01-09 NOTE — Progress Notes (Signed)
  Subjective:   Renee Hood is a 35 y.o. female who presents to the clinic status post D& E on 12/28/10 for missed abortion at [redacted] weeks GA. She was seen in clinic for postoperative abdominal pain on 01/02/11; she had a negative CT scan.  Since then, she reports minimal pain.  She has resumed work.  No other symptoms. Wants to discuss contraception; no sexual activity yet.  The following portions of the patient's history were reviewed and updated as appropriate: allergies, current medications, past family history, past medical history, past social history, past surgical history and problem list.  Review of Systems A comprehensive review of systems was negative.    Objective:    BP 121/86  Pulse 78  Wt 135 lb (61.236 kg)  LMP 09/29/2010    Physical exam deferred.  Assessment:    Doing well postoperatively. Operative findings again reviewed. Pathology report discussed.  Desires long-term contraception   Plan:   Reviewed all forms of birth control options available including abstinence; over the counter methods; oral contraceptive medication including pill, patch, or ring; Depo-Provera injection; Mirena and Paragard IUDs; permanent sterilization options including vasectomy, tubal ligation, or tubal occlusion using Essure coils. Risks and benefits reviewed.  Questions were answered.  Information was given to patient to review.   Patient interested in Mirena IUD.  She will return soon for placement; and was advised to continue pelvic rest until the IUD insertion.

## 2011-01-11 ENCOUNTER — Encounter: Payer: BC Managed Care – PPO | Admitting: Family Medicine

## 2011-01-17 ENCOUNTER — Encounter: Payer: Self-pay | Admitting: Family Medicine

## 2011-01-17 ENCOUNTER — Ambulatory Visit (INDEPENDENT_AMBULATORY_CARE_PROVIDER_SITE_OTHER): Payer: BC Managed Care – PPO | Admitting: Family Medicine

## 2011-01-17 VITALS — BP 126/90 | HR 79 | Ht 64.0 in | Wt 136.0 lb

## 2011-01-17 DIAGNOSIS — Z30430 Encounter for insertion of intrauterine contraceptive device: Secondary | ICD-10-CM

## 2011-01-17 DIAGNOSIS — Z01812 Encounter for preprocedural laboratory examination: Secondary | ICD-10-CM

## 2011-01-17 NOTE — Progress Notes (Signed)
Patient is here today to have a Mirena IUD inserted.  No questions.  Urine pregnancy test in negative.  Patient decline flu vaccine.

## 2011-01-17 NOTE — Patient Instructions (Signed)

## 2011-01-17 NOTE — Progress Notes (Signed)
  Subjective:    Patient ID: Renee Hood, female    DOB: August 29, 1975, 35 y.o.   MRN: [id number]  HPI S/p Missed AB with D and C.  Now would like a more permanent form of contraception.  She is interested in [place].   Review of Systems  Genitourinary: Negative for dysuria, hematuria, vaginal bleeding, vaginal discharge, menstrual problem and pelvic pain.       Objective:   Physical Exam  Vitals reviewed. Constitutional: She appears well-developed and well-nourished.  HENT:  Head: Normocephalic and atraumatic.  Neck: Normal range of motion.  Pulmonary/Chest: Effort normal.  Abdominal: Soft.  Genitourinary: Vagina normal and uterus normal.   Patient identified, informed consent performed, signed copy in chart, time out was performed.  Urine pregnancy test negative.  Speculum placed in the vagina.  Cervix visualized.  Cleaned with Betadine x 2.  Grasped anteriourly with a single tooth tenaculum.  Uterus sounded to 6cm.  A dilator was used.  Mirena IUD placed per manufacturer's recommendations.   Deployed without pain or difficulty.  Strings trimmed to 3 cm.   Patient given post procedure instructions and Mirena care card with expiration date.  Patient is asked to check IUD strings periodically and follow up in 4-6 weeks for IUD check.       Assessment & Plan:  S/p IUD insertion F/u 2 wks for string check.

## 2011-02-14 ENCOUNTER — Ambulatory Visit (INDEPENDENT_AMBULATORY_CARE_PROVIDER_SITE_OTHER): Payer: BC Managed Care – PPO | Admitting: Physician Assistant

## 2011-02-14 ENCOUNTER — Encounter: Payer: Self-pay | Admitting: Family Medicine

## 2011-02-14 DIAGNOSIS — Z30431 Encounter for routine checking of intrauterine contraceptive device: Secondary | ICD-10-CM

## 2011-02-14 DIAGNOSIS — O021 Missed abortion: Secondary | ICD-10-CM

## 2011-02-14 NOTE — Progress Notes (Signed)
Chief Complaint:  Contraception   Renee Hood is  35 y.o. [phone].  Patient's last menstrual period was 01/27/2011.Marland Kitchen  Her pregnancy status is negative.  She presents complaining of Contraception  Presents for routine IUD string check after placement ~ 3 weeks ago.  No Complaints  Obstetrical/Gynecological History: OB History    Grav Para Term Preterm Abortions TAB SAB Ect Mult Living   4 2 2  0 2 0 2 0 0 2      Past Medical History: Past Medical History  Diagnosis Date  . No pertinent past medical history     Past Surgical History: Past Surgical History  Procedure Date  . Cesarean section   . Dislocated hip 21 months    surgery to repair/tn  . Breast enhancement surgery 2006  . Dilation and evacuation 12/28/2010    Procedure: DILATATION AND EVACUATION (D&E);  Surgeon: Tereso Newcomer, MD;  Location: [facility];  Service: Gynecology;  Laterality: N/A;    Family History: Family History  Problem Relation Age of Onset  . Diabetes Maternal Grandmother   . Cancer Paternal Grandmother     colon cancer/under age 29    Social History: History  Substance Use Topics  . Smoking status: Never Smoker   . Smokeless tobacco: Never Used  . Alcohol Use: No    Allergies: No Known Allergies   Review of Systems - Negative except what has been reviewed in the HPI  Physical Exam   Blood pressure 115/78, pulse 61, height 5\' 4"  (1.626 m), weight 138 lb 2 oz (62.653 kg), last menstrual period 01/27/2011, not currently breastfeeding.  General: General appearance - alert, well appearing, and in no distress, oriented to person, place, and time and normal appearing weight Mental status - alert, oriented to person, place, and time, normal mood, behavior, speech, dress, motor activity, and thought processes, affect appropriate to mood Abdomen - soft, nontender, nondistended, no masses or organomegaly Focused Gynecological Exam: VULVA: normal appearing vulva with no masses, tenderness or  lesions, VAGINA: normal appearing vagina with normal color and discharge, no lesions, CERVIX: normal appearing cervix without discharge or lesions, Mirena strings visible ~ 2cm below the ext os, UTERUS: uterus is normal size, shape, consistency and nontender, ADNEXA: normal adnexa in size, nontender and no masses   Assessment: IUD check Patient Active Problem List  Diagnoses  . Abortion, missed    Plan: Placement confirmed RTC prn problems  Mikayla Chiusano E. 02/14/2011,10:35 AM

## 2011-02-14 NOTE — Patient Instructions (Signed)

## 2011-03-08 ENCOUNTER — Other Ambulatory Visit (INDEPENDENT_AMBULATORY_CARE_PROVIDER_SITE_OTHER): Payer: BC Managed Care – PPO | Admitting: *Deleted

## 2011-03-08 DIAGNOSIS — R319 Hematuria, unspecified: Secondary | ICD-10-CM

## 2011-03-08 LAB — POCT URINALYSIS DIPSTICK
Glucose, UA: NEGATIVE
Leukocytes, UA: NEGATIVE
Nitrite, UA: NEGATIVE
Spec Grav, UA: 1.01
Urobilinogen, UA: NEGATIVE

## 2011-03-08 NOTE — Progress Notes (Signed)
Patient is here to have urine checked for test of cure.  She had Bactrim DS for three days called in by the on call nurse.  Today her urine shows only a trace of blood with no other positives for uti.  I will send it for culture to be sure there is not a lingering infection.  She has finished the antibiotic.  She also complains of bleeding with intercourse that has recently started happening.  It is not just spotting but enough that she has to wear a pad.  She has made an appointment for next week to have this checked out.  She will call if anything changes or worsens.

## 2011-03-10 LAB — CULTURE, URINE COMPREHENSIVE: Colony Count: NO GROWTH

## 2011-03-15 ENCOUNTER — Ambulatory Visit (INDEPENDENT_AMBULATORY_CARE_PROVIDER_SITE_OTHER): Payer: BC Managed Care – PPO | Admitting: Family Medicine

## 2011-03-15 ENCOUNTER — Encounter: Payer: Self-pay | Admitting: Family Medicine

## 2011-03-15 VITALS — BP 120/77 | HR 81 | Ht 64.0 in | Wt 138.0 lb

## 2011-03-15 DIAGNOSIS — N390 Urinary tract infection, site not specified: Secondary | ICD-10-CM

## 2011-03-15 LAB — POCT URINALYSIS DIPSTICK
Ketones, UA: NEGATIVE
Nitrite, UA: NEGATIVE
Urobilinogen, UA: 0.2

## 2011-03-15 NOTE — Patient Instructions (Signed)
Contraceptive Devices (IUD) IUD stands for intrauterine device. Intrauterine means inside the womb (uterus). The purpose of the IUD is to prevent pregnancy. IUDs make it more difficult for your partner's sperm to get into your womb and into your fallopian tubes, where the eggs are fertilized. IUDs also alter the secretions of your cervix, which make it a stronger sperm barrier. They also affect the lining of the womb, so it is harder for an egg to implant. The IUD does not cause an abortion. There are 2 types of IUDs available:  Copper IUD gives off a small amount of copper inside the uterus. This prevents the sperm from going through the uterus, up into the fallopian tube, where the egg is fertilized. The copper IUD can also damage or prevent the fertilized egg from attaching on the inside lining of the uterus. It can stay in place for 10 years. The copper IUD can be used as an emergency contraceptive, if inserted within 5 days after having unprotected sexual intercourse.   Hormone IUD contains progestin (synthetic progesterone), and it releases this hormone into the uterus. The hormone thickens the mucus on the cervix and prevents sperm from entering the uterus. It also weakens the sperm that get into the uterus, so that the sperm and egg cannot live in the fallopian tube. It also makes the inside lining of the uterus thinner, which makes it difficult for a fertilized egg to attach to the uterus. The hormone progestin in the IUD decreases the amount of bleeding during a menstrual period and can be helpful in women who have heavy menstrual periods. The hormone IUD can stay in place for 5 years.  SIDE EFFECTS OF THE IUD:  There may be more cramping or pain with periods.   It may cause heavier, longer periods, which can cause lack of red blood cells (anemia) and can interfere with your daily and sexual activities.  This method of birth control is not usually the best choice for a woman with heavy or  prolonged periods. The birth control pill may be a better choice. IUDs work best for women who have already had a pregnancy, because the cervix is more open, making the insertion of the device easier and less painful. However, many women without children use the IUD. One of the main goals of patient selection is to prevent unintentionally inserting an IUD into a patient who has an STD (sexually transmitted disease), who is at high risk of exposure to an STD, or who is already pregnant. That is why the IUD is inserted during, or right after, a menstrual period. REASONS NOT TO USE AN IUD:  The womb or cervix is not shaped normally.   You have or have had a pelvic infection, such as an STD, in the past 3 months.   You have or suspect cancer in the female organs.   You have an abnormal Pap smear.   You have certain liver diseases.   There is severe infection or inflammation of the cervix (cervicitis).   You have unexplained vaginal bleeding.   You have heart valve problems (unless a heart specialist advises otherwise).   You are allergic to copper (rare).   You previously had a pregnancy outside the uterus (ectopic).   You are pregnant or suspect you are pregnant.   You have prolonged or heavy periods, or heavy pain or cramping with periods (except for the hormone IUD).   You have or suspect pelvic cancer.   You have an   STD.   The cervix or uterus has problems (cervical stenosis, fibroids in the uterus) making it difficult to insert the IUD.  IUDs should be removed when a woman becomes menopausal or pregnant. BENEFITS OF THE IUD:  You are always ready to have sexual intercourse.   The copper IUD does not interfere with your female hormones.   The copper IUD can be used as emergency contraception.   An IUD can be used while nursing.   It works immediately after insertion, and there is no problem getting pregnant when it is removed.   It does not interfere with foreplay.    The progesterone IUD can make heavy menstrual periods lighter.   The progesterone IUD can be used for 5 years.   The copper IUD can be used for 10 years.  RISKS AND COMPLICATIONS  Putting the IUD through the uterus, into the pelvis or abdomen (perforation of the uterus).   Losing the IUD (expulsion). This is more common in women who never had children.   When pregnant with an IUD, there is an increased chance of an infection and loss of the pregnancy.   Pregnancy in the fallopian tube (ectopic).   STD, in women who have more than 1 sex partner. The IUD does not protect against STDs.   Other minor side effects may include:   Headaches.   Feeling sick to your stomach (nausea).   Breast tenderness.   Depression.  PROCEDURE   The IUD is inserted during or right after a menstrual period, to make sure you are not pregnant.   You will lie on an exam table, naked from the waist down.   Your caregiver will do an exam to determine the size and position of your uterus.   Usually, an anesthetic is not needed.   Your caregiver may give you a pain pill to take, 1 or 2 hours before the procedure.   Sometimes, a paracervical block may be used to block and control any discomfort with insertion.   A tool (speculum) is then placed in your vagina (birth canal) so your caregiver can see the cervix.   A sound is sent into the uterus to check the depth of the uterus.   A slim instrument (IUD inserter), which is shaped like a drinking straw, is inserted through the small opening in your cervix and into your uterus.   Then, the IUD is pushed in with a plunger, much like a syringe, and the inserter is removed. There may be some cramping and pain during the insertion.   Relaxing helps to lessen the discomfort.   Following the procedure, you will usually spot blood. Have some pads with you. Avoid using tampons for 2 weeks. Bleeding after the procedure is normal. It varies from light  spotting for a few days to menstrual-like bleeding for up to 3 weeks. It may be like a period if it is near the time for your normal period.   You may also have mild cramping. Only take over-the-counter or prescription medicines for pain, discomfort, or fever as directed by your caregiver. Do not use aspirin, as this may increase bleeding.   Practice checking the string coming out of the cervix, to make sure the IUD is always in the uterus.  HOME CARE INSTRUCTIONS   Do not drive for 24 hours.   Only take over-the-counter or prescription medicines for pain, discomfort, or fever as directed by your caregiver.   No tampons, douching, or sexual intercourse for   2 weeks, or until your caregiver approves.   Rest at home for 24 hours. You may then resume normal activities, unless told otherwise by your caregiver.   Check your IUD prior to resuming sexual activity, to make sure it is in place. Make sure that you can feel the strings. An IUD can be pushed out and lost without the user even knowing it is gone. Also, if the strings are getting longer, it may mean that the IUD is being forced out of the uterus. This means it is no longer offering full protection from pregnancy.   Take any medications your caregiver has ordered, as directed.   Make sure to keep your recheck appointment, so your caregiver can make sure your IUD has remained in place. After that exam, yearly exams are advised, unless you cannot feel the strings of your IUD.   Check that the IUD is still in place by feeling for the strings after every menstrual period.  SEEK MEDICAL CARE IF:   Bleeding is heavier than a normal menstrual cycle.   You have an oral temperature above 102 F (38.9 C).   You have increasing cramps or pains, not relieved with medicine. Or you develop belly (abdominal) pain that does not seem to be related to the same area of earlier cramping and pain.   You are lightheaded, unusually weak, or have fainting  episodes.   You develop pain in the tops of your shoulders (shoulder strap areas).   You are having problems or questions, which have not been answered well enough by your caregiver.   You develop abdominal pain.   You have pain during sexual intercourse.   You cannot feel the IUD strings.   You have abnormal vaginal discharge.   You feel the IUD at the opening of the cervix in the vagina.   You think you are pregnant.   You miss your menstrual period.   The IUD string is hurting your sex partner.   The IUD string has gotten longer.  Document Released: 02/01/2004 Document Revised: 06/14/2010 Document Reviewed: 03/15/2009 [employer] Patient Information 2012 [employer]. 

## 2011-03-15 NOTE — Progress Notes (Signed)
  Subjective:    Patient ID: Renee Hood, female    DOB: 06/19/1975, 36 y.o.   MRN: [id number]  HPI Having bleeding post-intercourse with IUD.  Had one cycle which lighter than usual for her. She is somewhat ambivalent about having her IUD in.  Her partner does not want more kids at this point.     Review of Systems  Constitutional: Negative for fever and fatigue.  HENT: Negative for congestion and rhinorrhea.   Respiratory: Negative for choking and shortness of breath.   Gastrointestinal: Negative for nausea, diarrhea and abdominal distention.  Genitourinary: Negative for menstrual problem.  Musculoskeletal: Negative for arthralgias.       Objective:   Physical Exam  Vitals reviewed. Constitutional: She is oriented to person, place, and time. She appears well-developed and well-nourished.  HENT:  Head: Normocephalic and atraumatic.  Neck: Normal range of motion.  Cardiovascular: Normal rate.   Pulmonary/Chest: Effort normal.  Abdominal: Soft. There is no tenderness.  Genitourinary: Vagina normal and uterus normal.       Strings visualized and in nml appearance.   Neurological: She is alert and oriented to person, place, and time.          Assessment & Plan:  Probable cervical bleeding from IUD strings. Reassurance given.

## 2012-01-17 IMAGING — US US OB LIMITED
1 series · 5 of 5 positions shown · non-contrast
Comparison: none

[Series 1: us ob limited · 5 acquisitions, 5 frames shown]
[im 1/5]
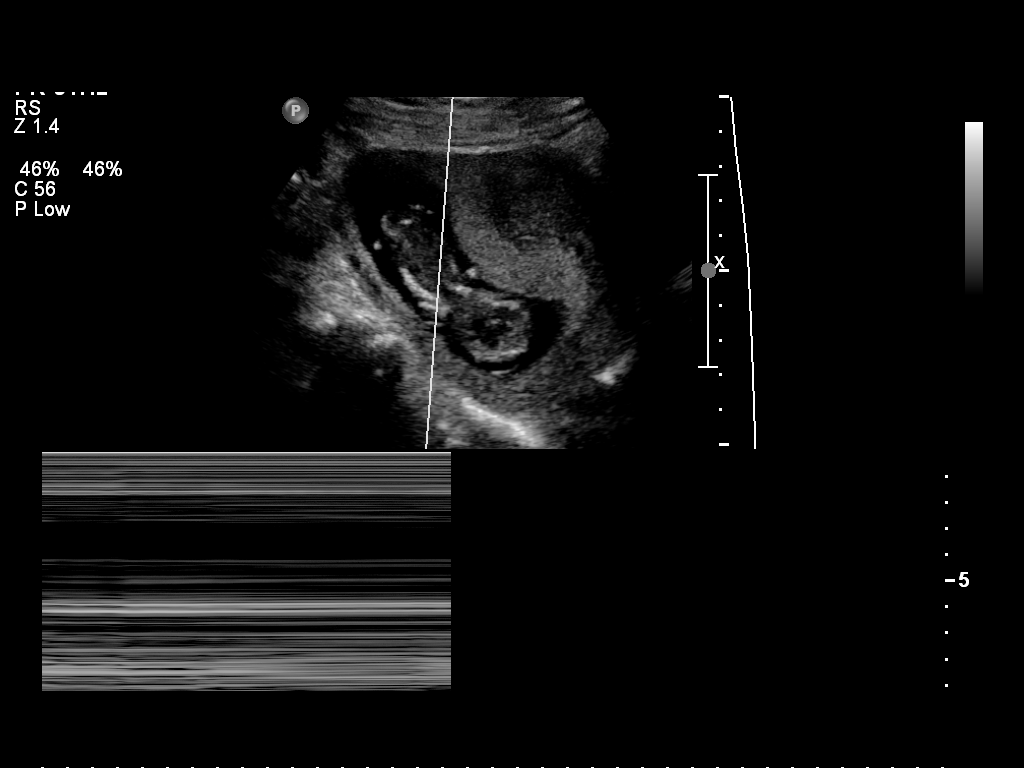
[im 2/5]
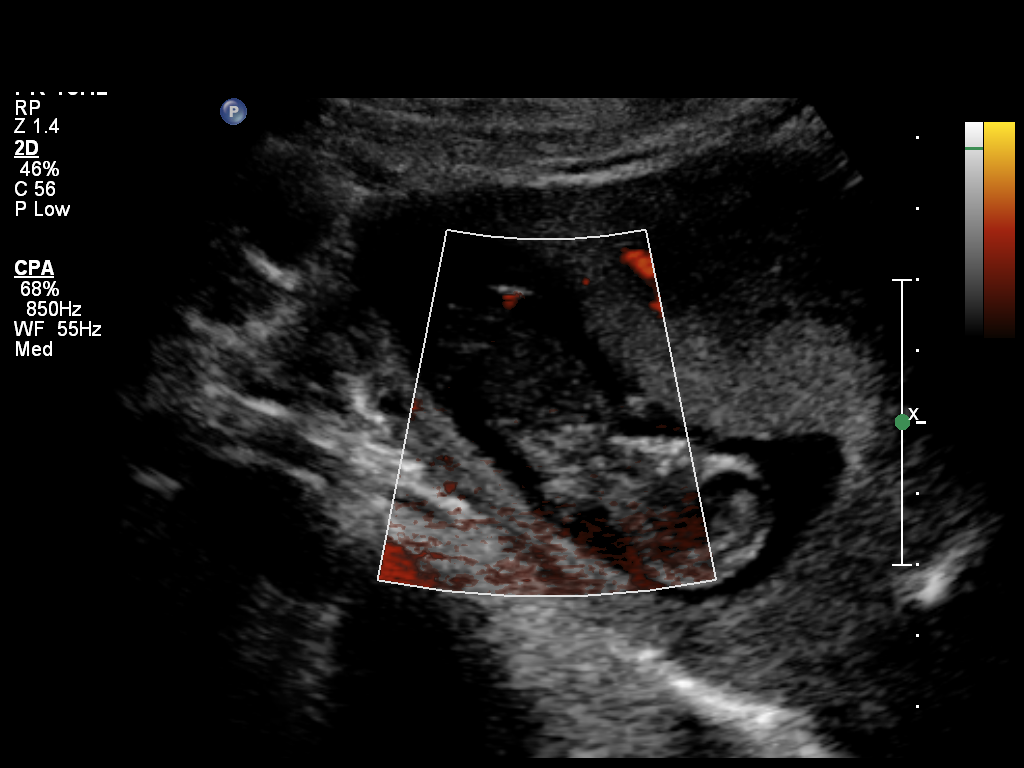
[im 3/5]
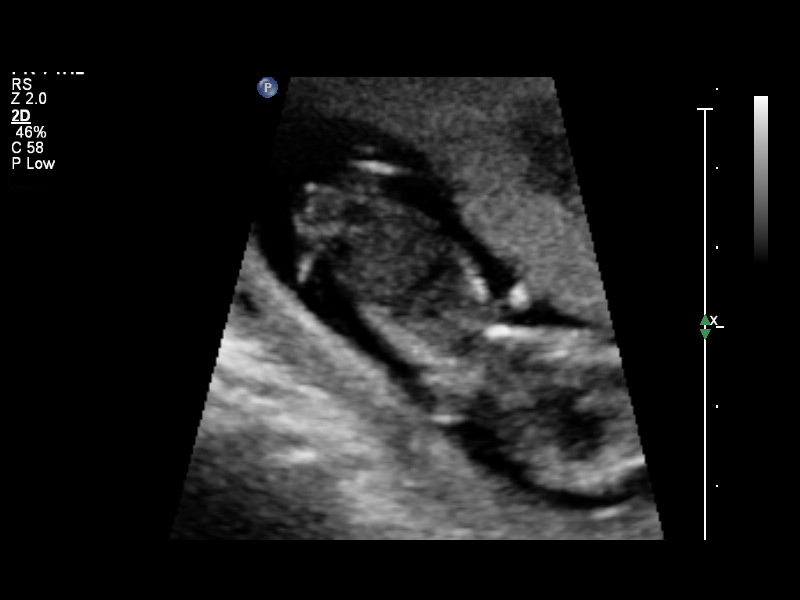
[im 4/5]
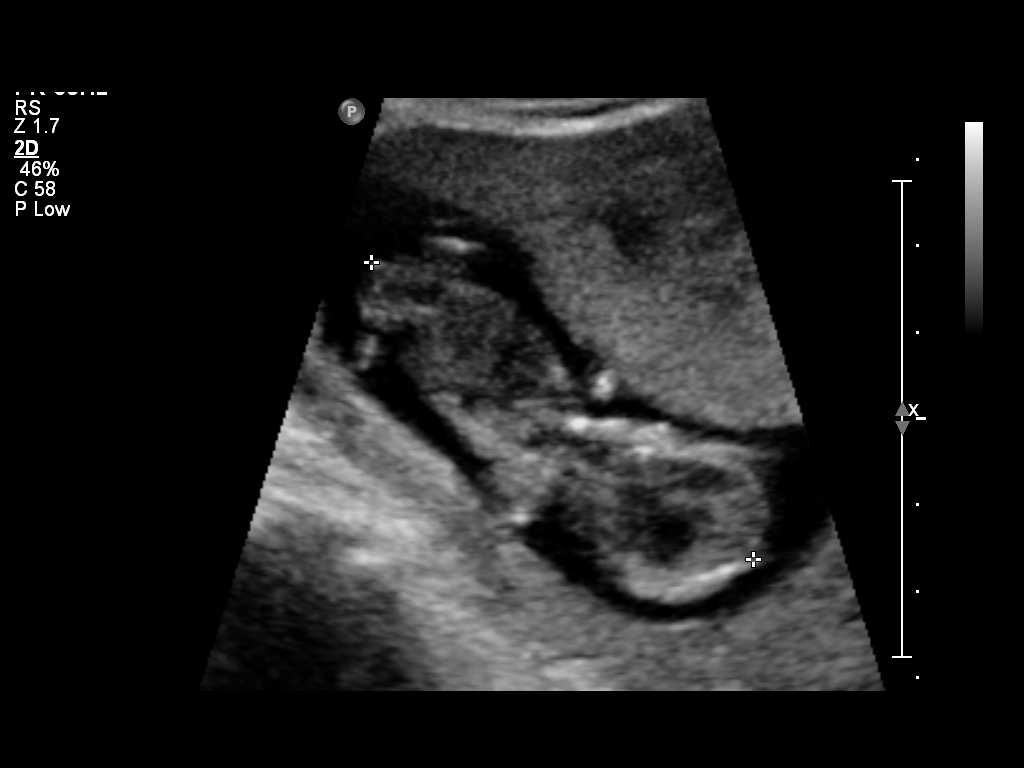
[im 5/5]
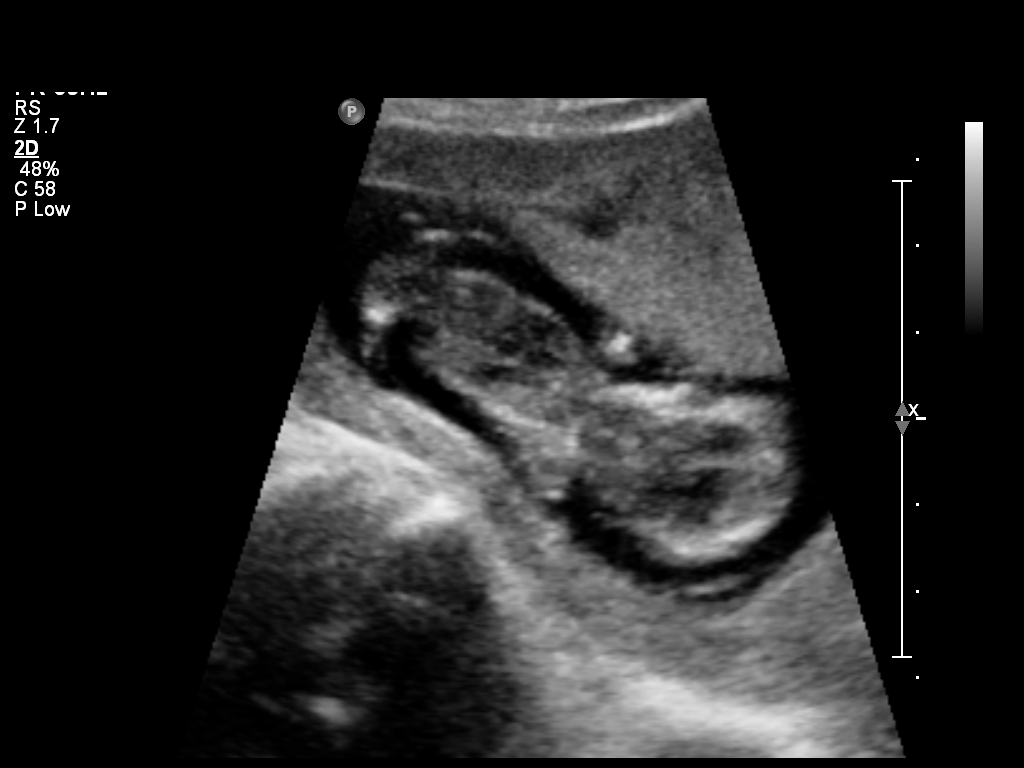

[5 of 5 positions shown; findings below may reference images not displayed]

Canned report from images found in remote index.

Refer to host system for actual result text.

## 2012-01-24 IMAGING — CT CT ABD-PELV W/ CM
1 of 3 series · 14 of 32 positions shown, 19 images · IV contrast (omnipaque)
Comparison: 08/27/2008

CLINICAL DATA: GI distress and pain following D/T/E on 12/28/2010.
Evaluate bowel and pelvic organs.  Severe abdominal pain.
Diarrhea.  Prior C-section 2229.

CT ABDOMEN AND PELVIS WITH CONTRAST
TECHNIQUE: Multidetector CT imaging of the abdomen and pelvis was
performed following the standard protocol during bolus
administration of intravenous contrast.
Contrast: 100mL OMNIPAQUE IOHEXOL 300 MG/ML IV SOLN

[Series 2: routine abdomen/pelvis with · axial · 0.68mm/px · z∈[-482,-127]mm · 14 of 83 slices shown, 19 images]
[im 6/83  soft-tissue]
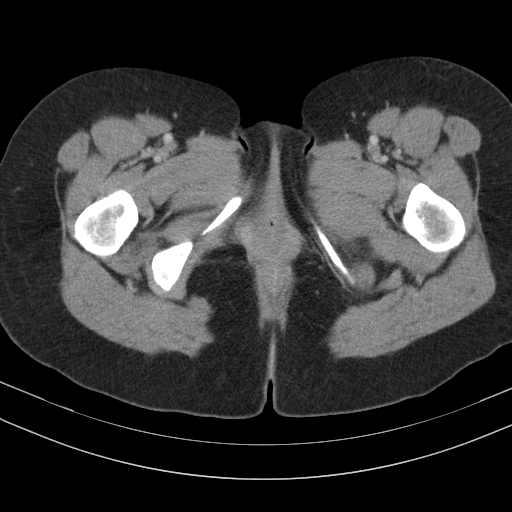
[im 6/83  bone]
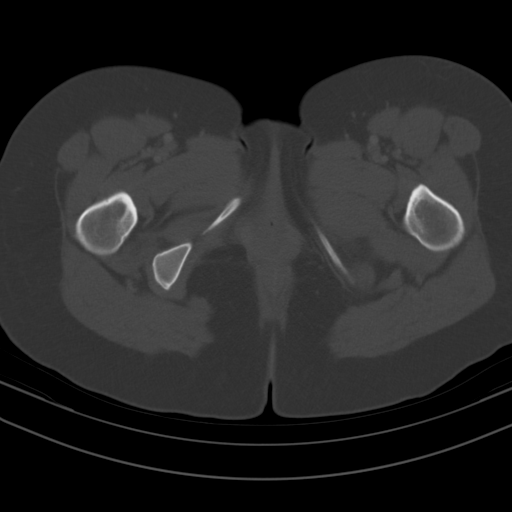
[im 11/83  soft-tissue]
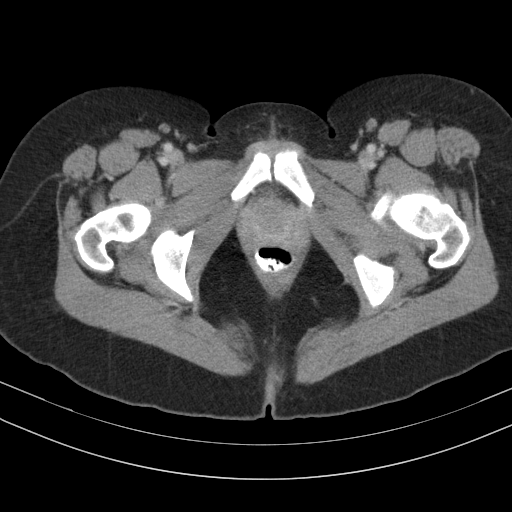
[im 16/83  soft-tissue]
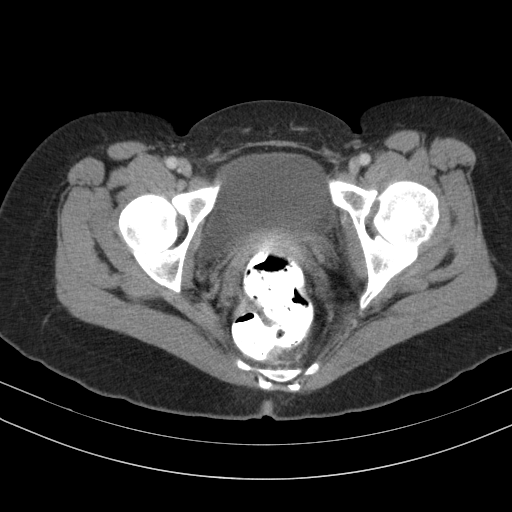
[im 26/83  soft-tissue]
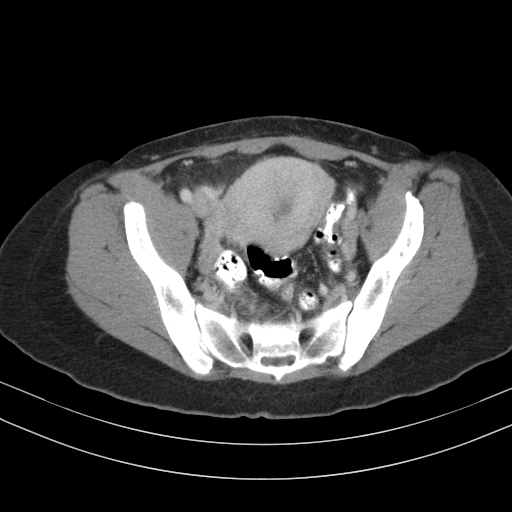
[im 31/83  soft-tissue]
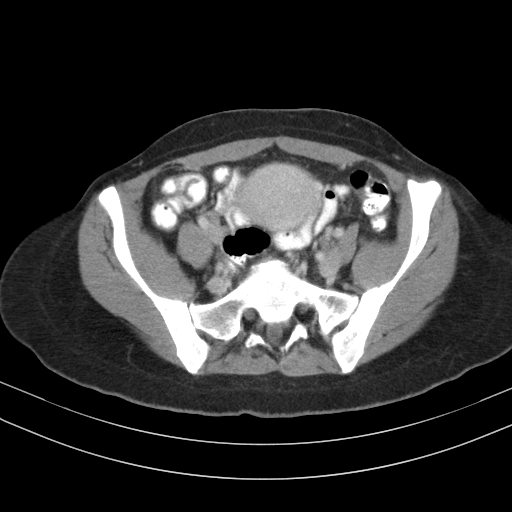
[im 36/83  soft-tissue]
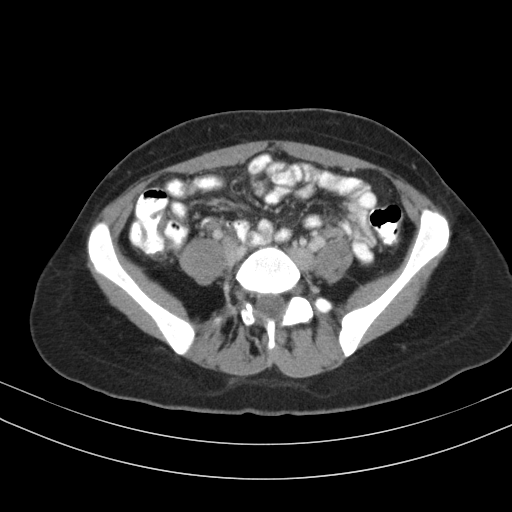
[im 42/83  soft-tissue]
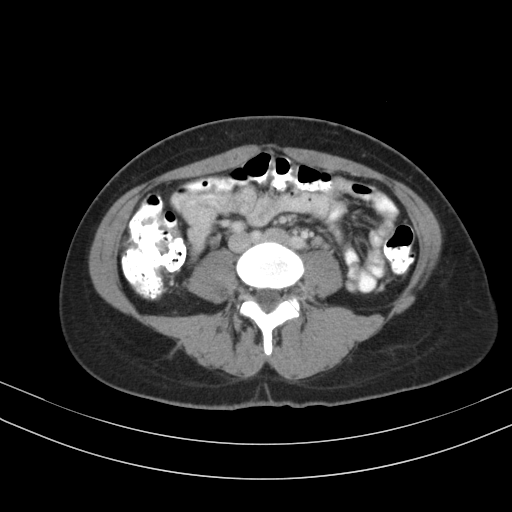
[im 47/83  soft-tissue]
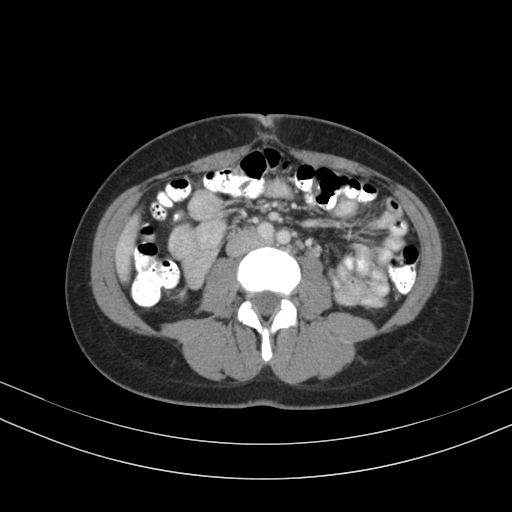
[im 52/83  soft-tissue]
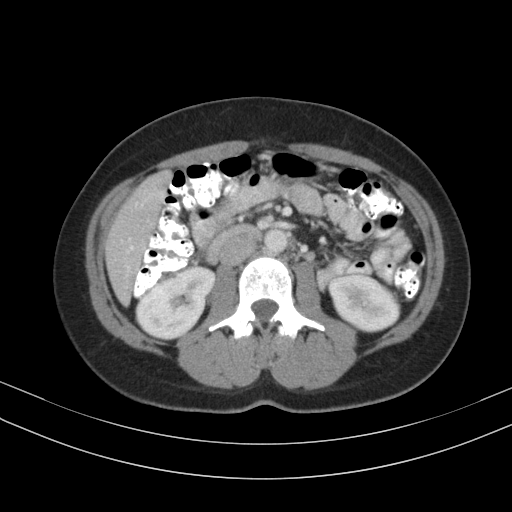
[im 52/83  bone]
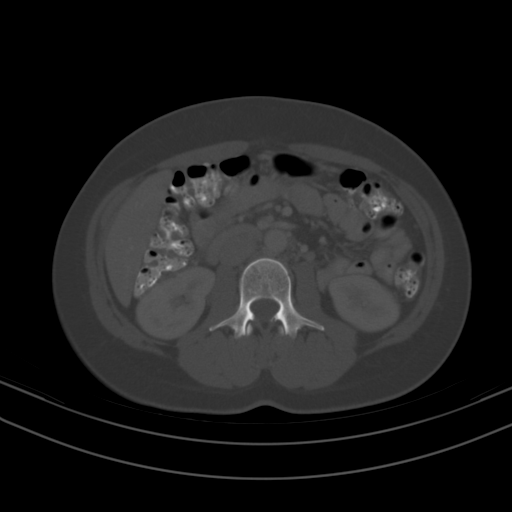
[im 57/83  soft-tissue]
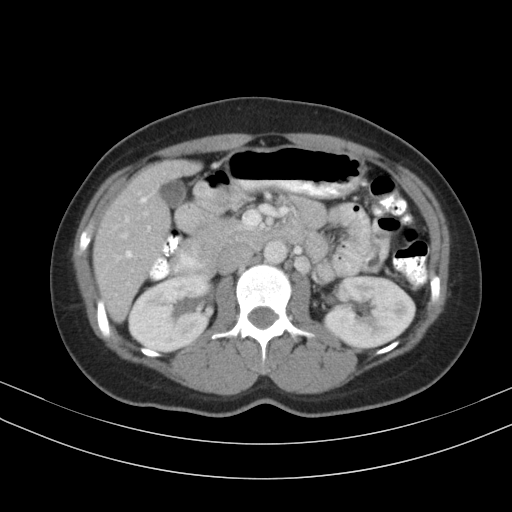
[im 62/83  lung]
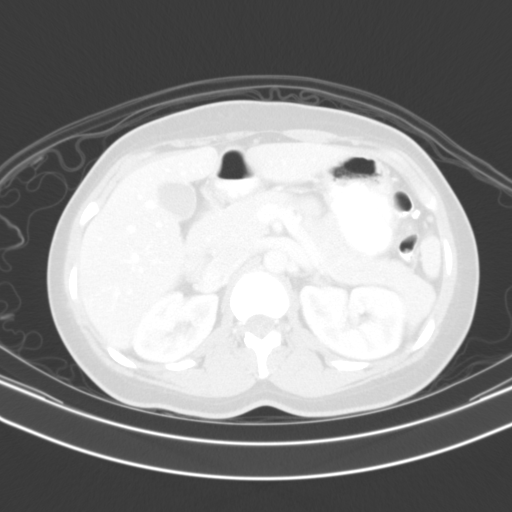
[im 67/83  soft-tissue]
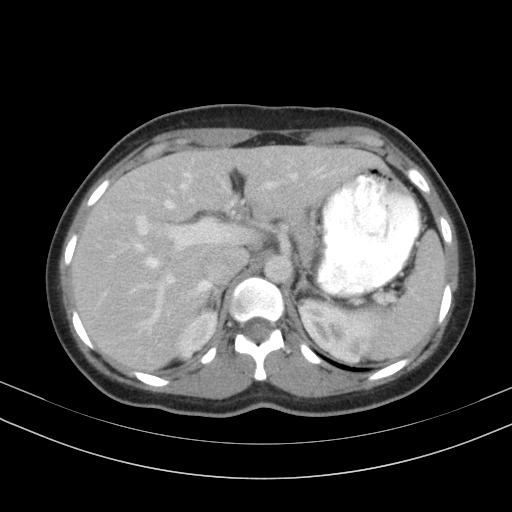
[im 67/83  lung]
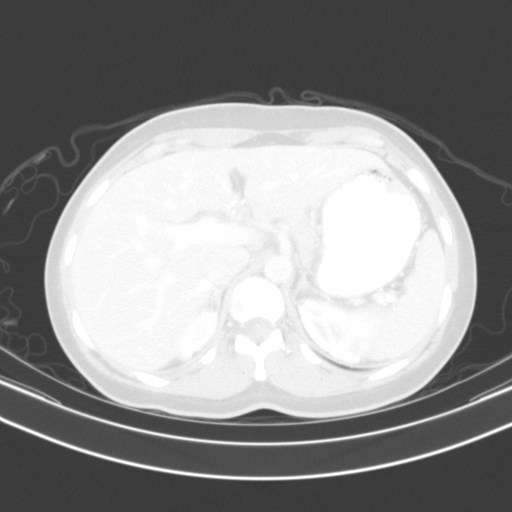
[im 72/83  soft-tissue]
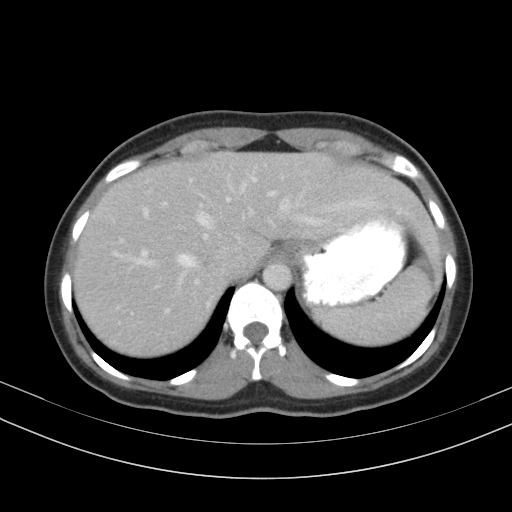
[im 72/83  lung]
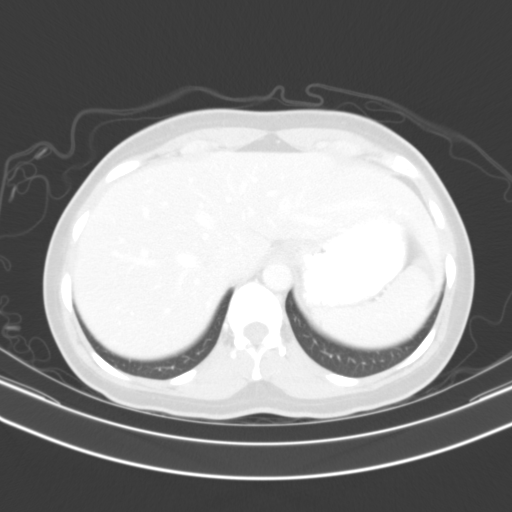
[im 77/83  soft-tissue]
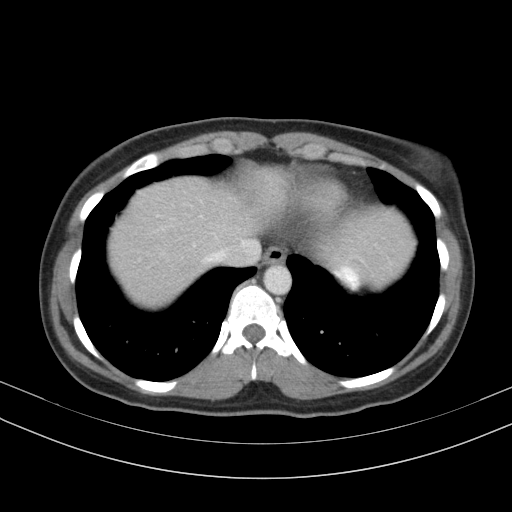
[im 77/83  lung]
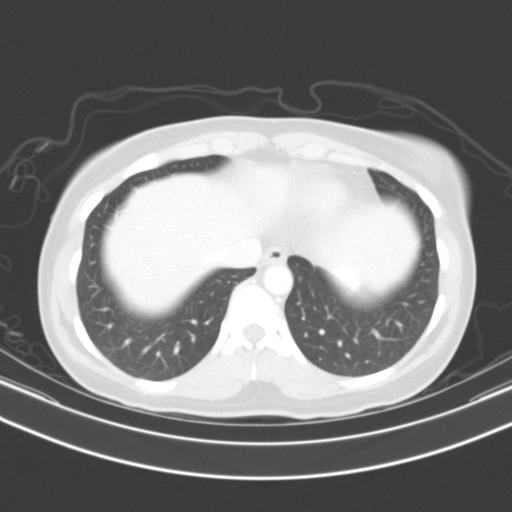

[14 of 32 positions shown; findings below may reference images not displayed]

FINDINGS: The patient has bilateral breast implants.  Lung bases
are unremarkable.  No focal abnormality is identified within the
liver, spleen, pancreas, adrenal glands, or right kidney.  Small
probable left renal cyst is identified in the mid pole region.
Gallbladder is present.  The the stomach and small bowel loops have
a normal appearance.  The appendix is well seen and has a normal
appearance.  Large bowel loops are normal in caliber and wall
thickness.  No evidence for colonic inflammation.

The uterus is mildly enlarged.  There is a small amount of fluid
within the uterine canal.  Adnexal regions are unremarkable in
appearance.  No free pelvic fluid.  No free intraperitoneal air.
No pelvic adenopathy.  The bladder has a normal appearance. Distal
ureters are normal in their appearance on the delayed study.
IMPRESSION: 1.  Low density within the uterus, consistent with fluid in the
canal.  Consider follow-up ultrasound as needed.
2.  No evidence for free fluid or free intraperitoneal air.  No
evidence for abscess or bowel inflammation.

## 2012-07-09 ENCOUNTER — Ambulatory Visit (INDEPENDENT_AMBULATORY_CARE_PROVIDER_SITE_OTHER): Payer: BC Managed Care – PPO | Admitting: Surgery

## 2012-07-17 ENCOUNTER — Ambulatory Visit (INDEPENDENT_AMBULATORY_CARE_PROVIDER_SITE_OTHER): Payer: BC Managed Care – PPO | Admitting: Surgery

## 2012-07-17 ENCOUNTER — Encounter (INDEPENDENT_AMBULATORY_CARE_PROVIDER_SITE_OTHER): Payer: Self-pay | Admitting: Surgery

## 2012-07-17 VITALS — BP 114/82 | HR 73 | Temp 98.4°F | Resp 14 | Ht 64.0 in | Wt 128.4 lb

## 2012-07-17 DIAGNOSIS — K436 Other and unspecified ventral hernia with obstruction, without gangrene: Secondary | ICD-10-CM

## 2012-07-17 NOTE — Patient Instructions (Signed)
See the Handout(s) we gave you.  Consider surgery.  Please call our office at [phone] if you wish to schedule surgery or if you have further questions / concerns.   Hernia A hernia occurs when an internal organ pushes out through a weak spot in the abdominal wall. Hernias most commonly occur in the groin and around the navel. Hernias often can be pushed back into place (reduced). Most hernias tend to get worse over time. Some abdominal hernias can get stuck in the opening (irreducible or incarcerated hernia) and cannot be reduced. An irreducible abdominal hernia which is tightly squeezed into the opening is at risk for impaired blood supply (strangulated hernia). A strangulated hernia is a medical emergency. Because of the risk for an irreducible or strangulated hernia, surgery may be recommended to repair a hernia. CAUSES   Heavy lifting.  Prolonged coughing.  Straining to have a bowel movement.  A cut (incision) made during an abdominal surgery. HOME CARE INSTRUCTIONS   Bed rest is not required. You may continue your normal activities.  Avoid lifting more than 10 pounds (4.5 kg) or straining.  Cough gently. If you are a smoker it is best to stop. Even the best hernia repair can break down with the continual strain of coughing. Even if you do not have your hernia repaired, a cough will continue to aggravate the problem.  Do not wear anything tight over your hernia. Do not try to keep it in with an outside bandage or truss. These can damage abdominal contents if they are trapped within the hernia sac.  Eat a normal diet.  Avoid constipation. Straining over long periods of time will increase hernia size and encourage breakdown of repairs. If you cannot do this with diet alone, stool softeners may be used. SEEK IMMEDIATE MEDICAL CARE IF:   You have a fever.  You develop increasing abdominal pain.  You feel nauseous or vomit.  Your hernia is stuck outside the abdomen, looks  discolored, feels hard, or is tender.  You have any changes in your bowel habits or in the hernia that are unusual for you.  You have increased pain or swelling around the hernia.  You cannot push the hernia back in place by applying gentle pressure while lying down. MAKE SURE YOU:   Understand these instructions.  Will watch your condition.  Will get help right away if you are not doing well or get worse. Document Released: 02/27/2005 Document Revised: 05/22/2011 Document Reviewed: 10/17/2007 Exit[employer] Patient Information 2013 [employer].  HERNIA REPAIR: POST OP INSTRUCTIONS  1. DIET: Follow a light bland diet the first 24 hours after arrival home, such as soup, liquids, crackers, etc.  Be sure to include lots of fluids daily.  Avoid fast food or heavy meals as your are more likely to get nauseated.  Eat a low fat the next few days after surgery. 2. Take your usually prescribed home medications unless otherwise directed. 3. PAIN CONTROL: a. Pain is best controlled by a usual combination of three different methods TOGETHER: i. Ice/Heat ii. Over the counter pain medication iii. Prescription pain medication b. Most patients will experience some swelling and bruising around the hernia(s) such as the bellybutton, groins, or old incisions.  Ice packs or heating pads (30-60 minutes up to 6 times a day) will help. Use ice for the first few days to help decrease swelling and bruising, then switch to heat to help relax tight/sore spots and speed recovery.  Some people prefer to   use ice alone, heat alone, alternating between ice & heat.  Experiment to what works for you.  Swelling and bruising can take several weeks to resolve.   c. It is helpful to take an over-the-counter pain medication regularly for the first few weeks.  Choose one of the following that works best for you: i. Naproxen (Aleve, etc)  Two 220mg tabs twice a day ii. Ibuprofen (Advil, etc) Three 200mg tabs four times a day  (every meal & bedtime) iii. Acetaminophen (Tylenol, etc) 325-650mg four times a day (every meal & bedtime) d. A  prescription for pain medication should be given to you upon discharge.  Take your pain medication as prescribed.  i. If you are having problems/concerns with the prescription medicine (does not control pain, nausea, vomiting, rash, itching, etc), please call us [phone] to see if we need to switch you to a different pain medicine that will work better for you and/or control your side effect better. ii. If you need a refill on your pain medication, please contact your pharmacy.  They will contact our office to request authorization. Prescriptions will not be filled after 5 pm or on week-ends. 4. Avoid getting constipated.  Between the surgery and the pain medications, it is common to experience some constipation.  Increasing fluid intake and taking a fiber supplement (such as Metamucil, Citrucel, FiberCon, MiraLax, etc) 1-2 times a day regularly will usually help prevent this problem from occurring.  A mild laxative (prune juice, Milk of Magnesia, MiraLax, etc) should be taken according to package directions if there are no bowel movements after 48 hours.   5. Wash / shower every day.  You may shower over the dressings as they are waterproof.   6. Remove your waterproof bandages 5 days after surgery.  You may leave the incision open to air.  You may replace a dressing/Band-Aid to cover the incision for comfort if you wish.  Continue to shower over incision(s) after the dressing is off.    7. ACTIVITIES as tolerated:   a. You may resume regular (light) daily activities beginning the next day-such as daily self-care, walking, climbing stairs-gradually increasing activities as tolerated.  If you can walk 30 minutes without difficulty, it is safe to try more intense activity such as jogging, treadmill, bicycling, low-impact aerobics, swimming, etc. b. Save the most intensive and strenuous  activity for last such as sit-ups, heavy lifting, contact sports, etc  Refrain from any heavy lifting or straining until you are off narcotics for pain control.   c. DO NOT PUSH THROUGH PAIN.  Let pain be your guide: If it hurts to do something, don't do it.  Pain is your body warning you to avoid that activity for another week until the pain goes down. d. You may drive when you are no longer taking prescription pain medication, you can comfortably wear a seatbelt, and you can safely maneuver your car and apply brakes. e. You may have sexual intercourse when it is comfortable.  8. FOLLOW UP in our office a. Please call [facility] at [phone] to set up an appointment to see your surgeon in the office for a follow-up appointment approximately 2-3 weeks after your surgery. b. Make sure that you call for this appointment the day you arrive home to insure a convenient appointment time. 9.  IF YOU HAVE DISABILITY OR FAMILY LEAVE FORMS, BRING THEM TO THE OFFICE FOR PROCESSING.  DO NOT GIVE THEM TO YOUR DOCTOR.  WHEN TO CALL US (  [phone]: 1. Poor pain control 2. Reactions / problems with new medications (rash/itching, nausea, etc)  3. Fever over 101.5 F (38.5 C) 4. Inability to urinate 5. Nausea and/or vomiting 6. Worsening swelling or bruising 7. Continued bleeding from incision. 8. Increased pain, redness, or drainage from the incision   The clinic staff is available to answer your questions during regular business hours (8:30am-5pm).  Please don't hesitate to call and ask to speak to one of our nurses for clinical concerns.   If you have a medical emergency, go to the nearest emergency room or call 911.  A surgeon from [facility] is always on call at the hospitals in [place]  [facility] [address], [address], [place], [place]  [address] ?  [address] [address], [place], [place]   [address] MAIN: [phone] ? TOLL FREE: [phone] ? FAX: [phone] [email or web address]  Managing Pain  Pain after surgery or related to activity is often due to strain/injury to muscle, tendon, nerves and/or incisions.  This pain is usually short-term and will improve in a few months.   Many people find it helpful to do the following things TOGETHER to help speed the process of healing and to get back to regular activity more quickly:  1. Avoid heavy physical activity a.  no lifting greater than 20 pounds b. Do not "push through" the pain.  Listen to your body and avoid positions and maneuvers than reproduce the pain c. Walking is okay as tolerated, but go slowly and stop when getting sore.  d. Remember: If it hurts to do it, then don't do it! 2. Take Anti-inflammatory medication  a. Take with food/snack around the clock for 1-2 weeks i. This helps the muscle and nerve tissues become less irritable and calm down faster b. Choose ONE of the following over-the-counter medications: i. Naproxen 220mg tabs (ex. Aleve) 1-2 pills twice a day  ii. Ibuprofen 200mg tabs (ex. Advil, Motrin) 3-4 pills with every meal and just before bedtime iii. Acetaminophen 500mg tabs (Tylenol) 1-2 pills with every meal and just before bedtime 3. Use a Heating pad or Ice/Cold Pack a. 4-6 times a day b. May use warm bath/hottub  or showers 4. Try Gentle Massage and/or Stretching  a. at the area of pain many times a day b. stop if you feel pain - do not overdo it  Try these steps together to help you body heal faster and avoid making things get worse.  Doing just one of these things may not be enough.    If you are not getting better after two weeks or are noticing you are getting worse, contact our office for further advice; we may need to re-evaluate you & see what other things we can do to help.   

## 2012-07-17 NOTE — Progress Notes (Signed)
Subjective:     Patient ID: Renee Hood, female   DOB: 04-24-1975, 37 y.o.   MRN: [id number]  HPI  Renee Hood  [birth year] [id number]  Patient Care Team: Gweneth Dimitri, MD as PCP - General (Family Medicine) Reva Bores, MD as Consulting Physician (Obstetrics and Gynecology) Ardeth Sportsman, MD as Consulting Physician (General Surgery)  This patient is a 37 y.o.female who presents today for surgical evaluation at the request of Dr. Corliss Blacker.   Reason for visit: Hernia of abdominal wall.  Pleasant active female.  She noticed a lump above her bellybutton around the time she was pregnant with her last child 11 years ago.  It is gradually gotten larger.  Not particularly painful.  Moderately active.  No nausea or vomiting.  Tends to have some loose bowel movements but no severe episodes of diarrhea.  She has had a few cesarean sections in the past.  Had a little mild cellulitis on one incision 11 years ago.  No other wound issues.  She comes today with her husband.  They are hoping to go to [facility] in mid-July for vacation.  Patient Active Problem List   Diagnosis Date Noted  . Abortion, missed 02/14/2011    Past Medical History  Diagnosis Date  . No pertinent past medical history     Past Surgical History  Procedure Laterality Date  . Cesarean section    . Dislocated hip  21 months    surgery to repair/tn  . Breast enhancement surgery  2006  . Dilation and evacuation  12/28/2010    Procedure: DILATATION AND EVACUATION (D&E);  Surgeon: Tereso Newcomer, MD;  Location: [facility];  Service: Gynecology;  Laterality: N/A;    History   Social History  . Marital Status: Divorced    Spouse Name: N/A    Number of Children: N/A  . Years of Education: N/A   Occupational History  . Not on file.   Social History Main Topics  . Smoking status: Former Games developer  . Smokeless tobacco: Former Neurosurgeon    Quit date: 07/18/1999  . Alcohol Use: Yes     Comment: social  . Drug Use: No  .  Sexually Active: Yes    Birth Control/ Protection: IUD     Comment: pregnant   Other Topics Concern  . Not on file   Social History Narrative  . No narrative on file    Family History  Problem Relation Age of Onset  . Diabetes Maternal Grandmother   . Cancer Paternal Grandmother     colon cancer/under age 29    Current Outpatient Prescriptions  Medication Sig Dispense Refill  . levonorgestrel (MIRENA) 20 MCG/24HR IUD 1 each by Intrauterine route once.      . sertraline (ZOLOFT) 25 MG tablet Take 25 mg by mouth daily.       No current facility-administered medications for this visit.     No Known Allergies  BP 114/82  Pulse 73  Temp(Src) 98.4 F (36.9 C) (Temporal)  Resp 14  Ht 5\' 4"  (1.626 m)  Wt 128 lb 6.4 oz (58.242 kg)  BMI 22.03 kg/m2  SpO2 98%  No results found.   Review of Systems  Constitutional: Negative for fever, chills, diaphoresis, appetite change and fatigue.  HENT: Negative for ear pain, sore throat, trouble swallowing, neck pain and ear discharge.   Eyes: Negative for photophobia, discharge and visual disturbance.  Respiratory: Negative for cough, choking, chest tightness and shortness of breath.  Cardiovascular: Negative for chest pain and palpitations.  Gastrointestinal: Negative for nausea, vomiting, abdominal pain, diarrhea, constipation, anal bleeding and rectal pain.  Genitourinary: Negative for dysuria, frequency and difficulty urinating.  Musculoskeletal: Negative for myalgias and gait problem.  Skin: Negative for color change, pallor and rash.  Neurological: Negative for dizziness, speech difficulty, weakness and numbness.  Hematological: Negative for adenopathy.  Psychiatric/Behavioral: Negative for confusion and agitation. The patient is not nervous/anxious.        Objective:   Physical Exam  Constitutional: She is oriented to person, place, and time. She appears well-developed and well-nourished. No distress.  HENT:  Head:  Normocephalic.  Mouth/Throat: Oropharynx is clear and moist. No oropharyngeal exudate.  Eyes: Conjunctivae and EOM are normal. Pupils are equal, round, and reactive to light. No scleral icterus.  Neck: Normal range of motion. Neck supple. No tracheal deviation present.  Cardiovascular: Normal rate, regular rhythm and intact distal pulses.   Pulmonary/Chest: Effort normal and breath sounds normal. No respiratory distress. She exhibits no tenderness.  Abdominal: Soft. She exhibits no distension and no mass. There is no tenderness. A hernia is present. Hernia confirmed positive in the ventral area. Hernia confirmed negative in the right inguinal area and confirmed negative in the left inguinal area.    Genitourinary: No vaginal discharge found.  Musculoskeletal: Normal range of motion. She exhibits no tenderness.  Lymphadenopathy:    She has no cervical adenopathy.       Right: No inguinal adenopathy present.       Left: No inguinal adenopathy present.  Neurological: She is alert and oriented to person, place, and time. No cranial nerve deficit. She exhibits normal muscle tone. Coordination normal.  Skin: Skin is warm and dry. No rash noted. She is not diaphoretic. No erythema.  Psychiatric: She has a normal mood and affect. Her behavior is normal. Judgment and thought content normal.       Assessment:     Incarcerated epigastric ventral hernia.  Not large nor very sympotomatic     Plan:     I suspect at some point she would benefit from surgery to repair this.  This may be one other situations where it can just be primarily done with stitches and avoid mesh.  She is thin and not a smoker in the defect probably is not large.  However, would start out with exploration.  Hopefully can do through superior umbilical curvilinear incision and work my way up.  However, it may be too far up to get to that way.  Hold off on any laparoscopic exploration.  The anatomy & physiology of the abdominal  wall was discussed.  The pathophysiology of hernias was discussed.  Natural history risks without surgery including progeressive enlargement, pain, incarceration & strangulation was discussed.   Contributors to complications such as smoking, obesity, diabetes, prior surgery, etc were discussed.   I feel the risks of no intervention will lead to serious problems that outweigh the operative risks; therefore, I recommended surgery to reduce and repair the hernia.  I explained laparoscopic techniques with possible need for an open approach.  I noted the probable use of mesh to patch and/or buttress the hernia repair  Risks such as bleeding, infection, abscess, need for further treatment, heart attack, death, and other risks were discussed.  I noted a good likelihood this will help address the problem.   Goals of post-operative recovery were discussed as well.  Possibility that this will not correct all symptoms was explained.  I stressed the importance of low-impact activity, aggressive pain control, avoiding constipation, & not pushing through pain to minimize risk of post-operative chronic pain or injury. Possibility of reherniation especially with smoking, obesity, diabetes, immunosuppression, and other health conditions was discussed.  We will work to minimize complications.     An educational handout further explaining the pathology & treatment options was given as well.  Questions were answered.  The patient expresses understanding & wishes to proceed with surgery.

## 2014-01-12 ENCOUNTER — Encounter (INDEPENDENT_AMBULATORY_CARE_PROVIDER_SITE_OTHER): Payer: Self-pay | Admitting: Surgery

## 2015-03-23 ENCOUNTER — Encounter (HOSPITAL_BASED_OUTPATIENT_CLINIC_OR_DEPARTMENT_OTHER): Payer: Self-pay | Admitting: Emergency Medicine

## 2015-03-23 ENCOUNTER — Emergency Department (HOSPITAL_BASED_OUTPATIENT_CLINIC_OR_DEPARTMENT_OTHER): Payer: BLUE CROSS/BLUE SHIELD

## 2015-03-23 ENCOUNTER — Emergency Department (HOSPITAL_BASED_OUTPATIENT_CLINIC_OR_DEPARTMENT_OTHER)
Admission: EM | Admit: 2015-03-23 | Discharge: 2015-03-23 | Disposition: A | Discharge status: Home / Self Care | Payer: BLUE CROSS/BLUE SHIELD | Attending: Emergency Medicine | Admitting: Emergency Medicine

## 2015-03-23 DIAGNOSIS — Z3202 Encounter for pregnancy test, result negative: Secondary | ICD-10-CM | POA: Diagnosis not present

## 2015-03-23 DIAGNOSIS — R1032 Left lower quadrant pain: Secondary | ICD-10-CM

## 2015-03-23 DIAGNOSIS — R102 Pelvic and perineal pain: Secondary | ICD-10-CM | POA: Diagnosis not present

## 2015-03-23 DIAGNOSIS — Z79899 Other long term (current) drug therapy: Secondary | ICD-10-CM | POA: Diagnosis not present

## 2015-03-23 DIAGNOSIS — Z87891 Personal history of nicotine dependence: Secondary | ICD-10-CM | POA: Insufficient documentation

## 2015-03-23 LAB — WET PREP, GENITAL
CLUE CELLS WET PREP: NONE SEEN
Sperm: NONE SEEN
TRICH WET PREP: NONE SEEN
YEAST WET PREP: NONE SEEN

## 2015-03-23 LAB — URINE MICROSCOPIC-ADD ON

## 2015-03-23 LAB — URINALYSIS, ROUTINE W REFLEX MICROSCOPIC
Glucose, UA: NEGATIVE mg/dL
KETONES UR: 40 mg/dL — AB
LEUKOCYTES UA: NEGATIVE
NITRITE: NEGATIVE
PROTEIN: NEGATIVE mg/dL
Specific Gravity, Urine: 1.019 (ref 1.005–1.030)
pH: 5.5 (ref 5.0–8.0)

## 2015-03-23 LAB — PREGNANCY, URINE: Preg Test, Ur: NEGATIVE

## 2015-03-23 MED ORDER — IBUPROFEN 800 MG PO TABS: 800.0000 mg | ORAL_TABLET | Freq: Three times a day (TID) | ORAL | Status: DC

## 2015-03-23 MED ORDER — IBUPROFEN 800 MG PO TABS
800.0000 mg | ORAL_TABLET | Freq: Once | ORAL | Status: AC
  Administered 2015-03-23: 800 mg via ORAL
  Filled 2015-03-23: qty 1

## 2015-03-23 NOTE — ED Notes (Signed)
Pt in ultrasound at present

## 2015-03-23 NOTE — Discharge Instructions (Signed)
Pelvic Pain, Female Your ultrasound shows a cystic structure on the right ovary that will require further evaluation. As your pain is on the left, this appears to be an incidental finding but requires review by your physician. At this time the cause of your pain is unclear. You're to return to the emergency department if you developed fever, worsening pain, vomiting or other new symptoms. Use ibuprofen as prescribed for pain control and follow-up with your physician on Friday. Female pelvic pain can be caused by many different things and start from a variety of places. Pelvic pain refers to pain that is located in the lower half of the abdomen and between your hips. The pain may occur over a short period of time (acute) or may be reoccurring (chronic). The cause of pelvic pain may be related to disorders affecting the female reproductive organs (gynecologic), but it may also be related to the bladder, kidney stones, an intestinal complication, or muscle or skeletal problems. Getting help right away for pelvic pain is important, especially if there has been severe, sharp, or a sudden onset of unusual pain. It is also important to get help right away because some types of pelvic pain can be life threatening.  CAUSES  Below are only some of the causes of pelvic pain. The causes of pelvic pain can be in one of several categories.   Gynecologic.  Pelvic inflammatory disease.  Sexually transmitted infection.  Ovarian cyst or a twisted ovarian ligament (ovarian torsion).  Uterine lining that grows outside the uterus (endometriosis).  Fibroids, cysts, or tumors.  Ovulation.  Pregnancy.  Pregnancy that occurs outside the uterus (ectopic pregnancy).  Miscarriage.  Labor.  Abruption of the placenta or ruptured uterus.  Infection.  Uterine infection (endometritis).  Bladder infection.  Diverticulitis.  Miscarriage related to a uterine infection (septic abortion).  Bladder.  Inflammation  of the bladder (cystitis).  Kidney stone(s).  Gastrointestinal.  Constipation.  Diverticulitis.  Neurologic.  Trauma.  Feeling pelvic pain because of mental or emotional causes (psychosomatic).  Cancers of the bowel or pelvis. EVALUATION  Your caregiver will want to take a careful history of your concerns. This includes recent changes in your health, a careful gynecologic history of your periods (menses), and a sexual history. Obtaining your family history and medical history is also important. Your caregiver may suggest a pelvic exam. A pelvic exam will help identify the location and severity of the pain. It also helps in the evaluation of which organ system may be involved. In order to identify the cause of the pelvic pain and be properly treated, your caregiver may order tests. These tests may include:   A pregnancy test.  Pelvic ultrasonography.  An X-ray exam of the abdomen.  A urinalysis or evaluation of vaginal discharge.  Blood tests. HOME CARE INSTRUCTIONS   Only take over-the-counter or prescription medicines for pain, discomfort, or fever as directed by your caregiver.   Rest as directed by your caregiver.   Eat a balanced diet.   Drink enough fluids to make your urine clear or pale yellow, or as directed.   Avoid sexual intercourse if it causes pain.   Apply warm or cold compresses to the lower abdomen depending on which one helps the pain.   Avoid stressful situations.   Keep a journal of your pelvic pain. Write down when it started, where the pain is located, and if there are things that seem to be associated with the pain, such as food or your menstrual  cycle.  Follow up with your caregiver as directed.  SEEK MEDICAL CARE IF:  Your medicine does not help your pain.  You have abnormal vaginal discharge. SEEK IMMEDIATE MEDICAL CARE IF:   You have heavy bleeding from the vagina.   Your pelvic pain increases.   You feel light-headed or  faint.   You have chills.   You have pain with urination or blood in your urine.   You have uncontrolled diarrhea or vomiting.   You have a fever or persistent symptoms for more than 3 days.  You have a fever and your symptoms suddenly get worse.   You are being physically or sexually abused.   This information is not intended to replace advice given to you by your health care provider. Make sure you discuss any questions you have with your health care provider.   Document Released: 01/25/2004 Document Revised: 11/18/2014 Document Reviewed: 06/19/2011 Elsevier Interactive Patient Education [employer]2016 [employer]

## 2015-03-23 NOTE — ED Provider Notes (Signed)
CSN: [id number]     Arrival date & time 03/23/15  1150 History   First MD Initiated Contact with Patient 03/23/15 1210     Chief Complaint  Patient presents with  . Abdominal Pain     (Consider location/radiation/quality/duration/timing/severity/associated sxs/prior Treatment) HPI Patient states she developed some left lower abdominal pain 3-1/2 days ago. Initially she thought it seemed like period cramps. She reports however that would've been 9 days early. She reports it has a sharp and cramping quality. It is worse with position change. No associated symptoms. No vaginal discharge. No pain burning or urgency of urination. No associated back pain. Patient reports she is sexually active but does not use birth control because her boyfriend has had a vasectomy. She denies risk of STD exposure. She denies prior history of ovarian cyst. He has not had similar pain. She reports distant history of a kidney infection. Has not tried anything for pain relief. Past Medical History  Diagnosis Date  . No pertinent past medical history    Past Surgical History  Procedure Laterality Date  . Cesarean section    . Dislocated hip  21 months    surgery to repair/tn  . Breast enhancement surgery  2006  . Dilation and evacuation  12/28/2010    Procedure: DILATATION AND EVACUATION (D&E);  Surgeon: Tereso Newcomer, MD;  Location: [facility];  Service: Gynecology;  Laterality: N/A;   Family History  Problem Relation Age of Onset  . Diabetes Maternal Grandmother   . Cancer Paternal Grandmother     colon cancer/under age 58   Social History  Substance Use Topics  . Smoking status: Former Games developer  . Smokeless tobacco: Former Neurosurgeon    Quit date: 07/18/1999  . Alcohol Use: Yes     Comment: social   OB History    Gravida Para Term Preterm AB TAB SAB Ectopic Multiple Living   4 2 2  0 2 0 2 0 0 2     Review of Systems  10 Systems reviewed and are negative for acute change except as noted in the  HPI.   Allergies  Review of patient's allergies indicates no known allergies.  Home Medications   Prior to Admission medications   Medication Sig Start Date End Date Taking? Authorizing Provider  ibuprofen (ADVIL,MOTRIN) 800 MG tablet Take 1 tablet (800 mg total) by mouth 3 (three) times daily. 03/23/15   Arby Barrette, MD  levonorgestrel (MIRENA) 20 MCG/24HR IUD 1 each by Intrauterine route once.    Historical Provider, MD  sertraline (ZOLOFT) 25 MG tablet Take 25 mg by mouth daily.    Historical Provider, MD   BP 112/77 mmHg  Pulse 75  Temp(Src) 98.4 F (36.9 C) (Oral)  Resp 18  Ht 5\' 4"  (1.626 m)  Wt 145 lb (65.772 kg)  BMI 24.88 kg/m2  SpO2 99%  LMP 03/04/2015 Physical Exam  Constitutional: She is oriented to person, place, and time. She appears well-developed and well-nourished.  HENT:  Head: Normocephalic and atraumatic.  Eyes: EOM are normal. Pupils are equal, round, and reactive to light.  Neck: Neck supple.  Cardiovascular: Normal rate, regular rhythm, normal heart sounds and intact distal pulses.   Pulmonary/Chest: Effort normal and breath sounds normal.  Abdominal: Soft. Bowel sounds are normal. She exhibits no distension. There is tenderness.  Pain to palpation left lower quadrant and suprapubic region without guarding or rebound.  Genitourinary: Vagina normal.  External genitalia normal. Speculum examination cervix is normal in appearance. No friability  or discharge. Bimanual examination moderate pain to palpation of the left adnexa. A palpable mass.  Musculoskeletal: Normal range of motion. She exhibits no edema.  Neurological: She is alert and oriented to person, place, and time. She has normal strength. Coordination normal. GCS eye subscore is 4. GCS verbal subscore is 5. GCS motor subscore is 6.  Skin: Skin is warm, dry and intact.  Psychiatric: She has a normal mood and affect.    ED Course  Procedures (including critical care time) Labs Review Labs  Reviewed  URINALYSIS, ROUTINE W REFLEX MICROSCOPIC (NOT AT [facility]) - Abnormal; Notable for the following:    Color, Urine AMBER (*)    APPearance CLOUDY (*)    Hgb urine dipstick SMALL (*)    Bilirubin Urine SMALL (*)    Ketones, ur 40 (*)    All other components within normal limits  URINE MICROSCOPIC-ADD ON - Abnormal; Notable for the following:    Squamous Epithelial / LPF 6-30 (*)    Bacteria, UA MANY (*)    All other components within normal limits  WET PREP, GENITAL  PREGNANCY, URINE  GC/CHLAMYDIA PROBE AMP ([place]) NOT AT [facility]    Imaging Review Koreas Transvaginal Non-ob  03/23/2015  CLINICAL DATA:  Left lower quadrant pain for 3 days. EXAM: TRANSABDOMINAL AND TRANSVAGINAL ULTRASOUND OF PELVIS DOPPLER ULTRASOUND OF OVARIES TECHNIQUE: Both transabdominal and transvaginal ultrasound examinations of the pelvis were performed. Transabdominal technique was performed for global imaging of the pelvis including uterus, ovaries, adnexal regions, and pelvic cul-de-sac. It was necessary to proceed with endovaginal exam following the transabdominal exam to visualize the endometrium and ovaries. Color and duplex Doppler ultrasound was utilized to evaluate blood flow to the ovaries. COMPARISON:  None. FINDINGS: Uterus Measurements: 7.8 x 4.8 x 5.9 cm. Retroflexed. No fibroids or other mass visualized. Endometrium Thickness: 7 mm.  No focal abnormality visualized. Right ovary Measurements: 3.3 x 1.7 x 2.4 cm. 2 x 1.9 x 1.5 cm heterogeneous mass adjacent to versus arising from the right ovary with peripheral Doppler flow. Left ovary Measurements: 2.2 x 2.7 x 2.1 cm. Normal appearance/no adnexal mass. Pulsed Doppler evaluation of both ovaries demonstrates normal low-resistance arterial and venous waveforms. Other findings Small amount of pelvic free fluid.  Prominent pelvic vasculature. IMPRESSION: 1. No ovarian torsion. 2. 2 x 1.9 x 1.5 cm heterogeneous mass likely arising from the right ovary with  peripheral Doppler flow. This may represent a hemorrhagic cyst versus endometrioma versus cystic neoplasm. Recommend follow-up pelvic ultrasound in 6-8 weeks. Electronically Signed   By: Elige KoHetal  Patel   On: 03/23/2015 13:53   [place] Pelvis Complete  03/23/2015  CLINICAL DATA:  Left lower quadrant pain for 3 days. EXAM: TRANSABDOMINAL AND TRANSVAGINAL ULTRASOUND OF PELVIS DOPPLER ULTRASOUND OF OVARIES TECHNIQUE: Both transabdominal and transvaginal ultrasound examinations of the pelvis were performed. Transabdominal technique was performed for global imaging of the pelvis including uterus, ovaries, adnexal regions, and pelvic cul-de-sac. It was necessary to proceed with endovaginal exam following the transabdominal exam to visualize the endometrium and ovaries. Color and duplex Doppler ultrasound was utilized to evaluate blood flow to the ovaries. COMPARISON:  None. FINDINGS: Uterus Measurements: 7.8 x 4.8 x 5.9 cm. Retroflexed. No fibroids or other mass visualized. Endometrium Thickness: 7 mm.  No focal abnormality visualized. Right ovary Measurements: 3.3 x 1.7 x 2.4 cm. 2 x 1.9 x 1.5 cm heterogeneous mass adjacent to versus arising from the right ovary with peripheral Doppler flow. Left ovary Measurements: 2.2 x 2.7  x 2.1 cm. Normal appearance/no adnexal mass. Pulsed Doppler evaluation of both ovaries demonstrates normal low-resistance arterial and venous waveforms. Other findings Small amount of pelvic free fluid.  Prominent pelvic vasculature. IMPRESSION: 1. No ovarian torsion. 2. 2 x 1.9 x 1.5 cm heterogeneous mass likely arising from the right ovary with peripheral Doppler flow. This may represent a hemorrhagic cyst versus endometrioma versus cystic neoplasm. Recommend follow-up pelvic ultrasound in 6-8 weeks. Electronically Signed   By: Elige Ko   On: 03/23/2015 13:53   [place] Art/ven Flow Abd Pelv Doppler  03/23/2015  CLINICAL DATA:  Left lower quadrant pain for 3 days. EXAM: TRANSABDOMINAL AND TRANSVAGINAL  ULTRASOUND OF PELVIS DOPPLER ULTRASOUND OF OVARIES TECHNIQUE: Both transabdominal and transvaginal ultrasound examinations of the pelvis were performed. Transabdominal technique was performed for global imaging of the pelvis including uterus, ovaries, adnexal regions, and pelvic cul-de-sac. It was necessary to proceed with endovaginal exam following the transabdominal exam to visualize the endometrium and ovaries. Color and duplex Doppler ultrasound was utilized to evaluate blood flow to the ovaries. COMPARISON:  None. FINDINGS: Uterus Measurements: 7.8 x 4.8 x 5.9 cm. Retroflexed. No fibroids or other mass visualized. Endometrium Thickness: 7 mm.  No focal abnormality visualized. Right ovary Measurements: 3.3 x 1.7 x 2.4 cm. 2 x 1.9 x 1.5 cm heterogeneous mass adjacent to versus arising from the right ovary with peripheral Doppler flow. Left ovary Measurements: 2.2 x 2.7 x 2.1 cm. Normal appearance/no adnexal mass. Pulsed Doppler evaluation of both ovaries demonstrates normal low-resistance arterial and venous waveforms. Other findings Small amount of pelvic free fluid.  Prominent pelvic vasculature. IMPRESSION: 1. No ovarian torsion. 2. 2 x 1.9 x 1.5 cm heterogeneous mass likely arising from the right ovary with peripheral Doppler flow. This may represent a hemorrhagic cyst versus endometrioma versus cystic neoplasm. Recommend follow-up pelvic ultrasound in 6-8 weeks. Electronically Signed   By: Elige Ko   On: 03/23/2015 13:53   I have personally reviewed and evaluated these images and lab results as part of my medical decision-making.   EKG Interpretation None      MDM   Final diagnoses:  Pelvic pain in female   At this time, the patient has pelvic pain of unknown etiology. He has been present for approximately 3 days. Vital signs are stable and patient has no associated constitutional symptoms. There is no flank or back pain associated. I felt this pain was most likely to be GYN in origin.  Kidney stone is of lower probability without hematuria and no associated flank pain. It also is much more reproducible that would be anticipated with kidney stone. Diverticulitis is a consideration however patient shows no signs of associated infection or GI dysfunction. At this time, with the patient well in appearance, she'll be treated with ibuprofen which has been helpful in treating her pain in the emergency department and follow-up as an outpatient with her physician in 3 days for reassessment. She is counseled on signs and symptoms for which return. Ultrasound shows a cystic like structure on the right which will require ongoing follow-up however this does not appear to have any association with today's presentation.   Arby Barrette, MD 03/23/15 [phone]

## 2015-03-23 NOTE — ED Notes (Addendum)
Pt with left sided abdominal pain since Saturday , pain worse with movement, last bm Sunday pt states that she thought it was just gas pain, but pain has not been relieved, she reports pain is worse with movement but nothing makes it better. History of loose stools.

## 2015-03-23 NOTE — ED Notes (Signed)
Pt c/o left lower abdominal pain since Saturday.  Pain comes and goes with position and movement.  Pt's last BM on Sunday and pt hasn't eaten much in past few days due to thoughts it may be constipation.  Pain with palpation to left lower pelvic & suprapubic abdomen.

## 2015-03-25 LAB — GC/CHLAMYDIA PROBE AMP (~~LOC~~) NOT AT ARMC
CHLAMYDIA, DNA PROBE: NEGATIVE
Neisseria Gonorrhea: NEGATIVE

## 2015-04-02 ENCOUNTER — Other Ambulatory Visit (HOSPITAL_COMMUNITY): Payer: Self-pay | Admitting: Family Medicine

## 2015-04-02 DIAGNOSIS — N838 Other noninflammatory disorders of ovary, fallopian tube and broad ligament: Secondary | ICD-10-CM

## 2015-05-25 ENCOUNTER — Ambulatory Visit (HOSPITAL_COMMUNITY): Payer: BLUE CROSS/BLUE SHIELD

## 2016-04-03 ENCOUNTER — Other Ambulatory Visit (HOSPITAL_BASED_OUTPATIENT_CLINIC_OR_DEPARTMENT_OTHER): Payer: Self-pay | Admitting: Family Medicine

## 2016-04-03 ENCOUNTER — Encounter (HOSPITAL_BASED_OUTPATIENT_CLINIC_OR_DEPARTMENT_OTHER): Payer: Self-pay

## 2016-04-03 ENCOUNTER — Ambulatory Visit (HOSPITAL_BASED_OUTPATIENT_CLINIC_OR_DEPARTMENT_OTHER)
Admission: RE | Admit: 2016-04-03 | Discharge: 2016-04-03 | Disposition: A | Discharge status: Home / Self Care | Payer: Managed Care, Other (non HMO) | Source: Ambulatory Visit | Attending: Family Medicine | Admitting: Family Medicine

## 2016-04-03 DIAGNOSIS — N8312 Corpus luteum cyst of left ovary: Secondary | ICD-10-CM | POA: Diagnosis not present

## 2016-04-03 DIAGNOSIS — K5732 Diverticulitis of large intestine without perforation or abscess without bleeding: Secondary | ICD-10-CM | POA: Insufficient documentation

## 2016-04-03 DIAGNOSIS — R1032 Left lower quadrant pain: Secondary | ICD-10-CM

## 2016-04-03 MED ORDER — IOPAMIDOL (ISOVUE-300) INJECTION 61%
100.0000 mL | Freq: Once | INTRAVENOUS | Status: AC | PRN
  Administered 2016-04-03: 100 mL via INTRAVENOUS

## 2016-04-19 ENCOUNTER — Ambulatory Visit: Payer: Self-pay | Admitting: Surgery

## 2016-04-19 NOTE — H&P (Signed)
Renee Hood 04/19/2016 3:13 PM Location: [facility] Patient #: [id number] DOB: 1975-07-21 Divorced / Language: [place] / Race: White Female  History of Present Illness (Chelsea A. Fredricka Bonine MD; 04/19/2016 4:08 PM) Patient words: Otherwise healthy 41 year old woman with a long-standing history of an epigastric hernia. She initially noticed it as a lump above her umbilicus around 15 years ago while she was pregnant. It was previously asymptomatic, but in the last few months she has had the occasional twinge of discomfort in the area. Denies nausea, vomiting, or increased size of the hernia. A few weeks ago she was however, diagnosed with mild uncompensated diverticulitis which had caused her some left lower quadrant and flank pain. She had been seen by Dr. Michaell Cowing a few years ago and was slated to undergo hernia repair at that time but had kept putting it off until now, and she feels that she is ready to have surgery.  Prior abdominal surgeries include C-sections.  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 She graduated from [employer] and lived in [place] for about 10 years before moving up here.  The patient is a 41 year old female.   Past Surgical History Timmothy Euler, [place]; 04/19/2016 3:14 PM) Breast Augmentation Bilateral. Cesarean Section - Multiple Hip Surgery Left. Oral Surgery  Diagnostic Studies History Timmothy Euler, [place]; 04/19/2016 3:14 PM) Colonoscopy never Mammogram never Pap Smear 1-5 years ago  Allergies Timmothy Euler, CMA; 04/19/2016 3:14 PM) Sulfacetamide *CHEMICALS* Anaphylaxis. Allergies Reconciled  Medication History Timmothy Euler, [place]; 04/19/2016 3:14 PM) No Current Medications Medications Reconciled  Social History Timmothy Euler, [place]; 04/19/2016 3:14 PM) Alcohol use Occasional alcohol use. Caffeine use Coffee. No drug use Tobacco use Never smoker.  Family History Timmothy Euler, [place]; 04/19/2016  3:14 PM) Alcohol Abuse Father. Anesthetic complications Daughter. Ischemic Bowel Disease Father.  Pregnancy / Birth History Timmothy Euler, [place]; 04/19/2016 3:14 PM) Age at menarche 13 years. Contraceptive History Depo-provera, Intrauterine device, Oral contraceptives. Gravida 4 Maternal age 12-25 Para 2 Regular periods  Other Problems Timmothy Euler, [place]; 04/19/2016 3:14 PM) Anxiety Disorder Depression Diverticulosis     Review of Systems Timmothy Euler CMA; 04/19/2016 3:14 PM) General Not Present- Appetite Loss, Chills, Fatigue, Fever, Night Sweats, Weight Gain and Weight Loss. Skin Not Present- Change in Wart/Mole, Dryness, Hives, Jaundice, New Lesions, Non-Healing Wounds, Rash and Ulcer. HEENT Not Present- Earache, Hearing Loss, Hoarseness, Nose Bleed, Oral Ulcers, Ringing in the Ears, Seasonal Allergies, Sinus Pain, Sore Throat, Visual Disturbances, Wears glasses/contact lenses and Yellow Eyes. Respiratory Not Present- Bloody sputum, Chronic Cough, Difficulty Breathing, Snoring and Wheezing. Breast Not Present- Breast Mass, Breast Pain, Nipple Discharge and Skin Changes. Cardiovascular Not Present- Chest Pain, Difficulty Breathing Lying Down, Leg Cramps, Palpitations, Rapid Heart Rate, Shortness of Breath and Swelling of Extremities. Gastrointestinal Present- Gets full quickly at meals. Not Present- Abdominal Pain, Bloating, Bloody Stool, Change in Bowel Habits, Chronic diarrhea, Constipation, Difficulty Swallowing, Excessive gas, Hemorrhoids, Indigestion, Nausea, Rectal Pain and Vomiting. Female Genitourinary Not Present- Frequency, Nocturia, Painful Urination, Pelvic Pain and Urgency. Musculoskeletal Not Present- Back Pain, Joint Pain, Joint Stiffness, Muscle Pain, Muscle Weakness and Swelling of Extremities. Neurological Not Present- Decreased Memory, Fainting, Headaches, Numbness, Seizures, Tingling, Tremor, Trouble walking and Weakness. Psychiatric Not Present- Anxiety,  Bipolar, Change in Sleep Pattern, Depression, Fearful and Frequent crying. Hematology Present- Easy Bruising. Not Present- Blood Thinners, Excessive bleeding, Gland problems, HIV and Persistent Infections.  Vitals Barron Alvine [place] CMA; 04/19/2016  3:15 PM) 04/19/2016 3:14 PM Weight: 143.6 lb Height: 64in Body Surface Area: 1.7 m Body Mass Index: 24.65 kg/m  Temp.: 98.39F  Pulse: 87 (Regular)  BP: 122/74 (Sitting, Left Arm, Standard)      Physical Exam (Chelsea A. Fredricka Bonineonnor MD; 04/19/2016 4:05 PM)  General Note: alert, well-appearing  Integumentary Note: no lesions or rashes on limited skin exam  Head and Neck Note: normocephalic, neck without mass or thyromegaly  Eye Note: anicteric, Ectraocular motion intact  ENMT Note: moist mucus membranes, good dentition  Chest and Lung Exam Note: unlabored respirations, symmetrical air entry  Cardiovascular Note: regular rate and rhythm, palpable distal pulses, no pedal edema  Abdomen Note: soft, nontender, nondistended. Small amount of incarcerated fat about 3cm superior to umbilicus and just to the right of midline, fascial defect not palpable. no organomegaly  Neurologic Note: grossly normal, normal gait  Neuropsychiatric Note: normal mood and affect, appropriate insight  Musculoskeletal Note: strength symmetrical throughout, no deformity    Assessment & Plan (Chelsea A. 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 Discussed risks of bleeding, pain, scarring, wound infection, hernia recurrence, intraabdominal injury especially in the case of laparoscopic surgery. She and her husband asked insightful and appropriate questions. Ultimately decided on open repair. Will plan for surgery in the coming weeks.

## 2016-05-04 ENCOUNTER — Encounter (HOSPITAL_COMMUNITY): Payer: Self-pay

## 2016-05-04 NOTE — Patient Instructions (Signed)
Kandis BanLori C Largo  05/04/2016   Your procedure is scheduled on: 05/08/16  [facility]  [facility] to 3rd floor to  Short Stay Center at      [phone].  Call this number if you have problems the morning of surgery [phone]   Remember: ONLY 1 PERSON MAY GO WITH YOU TO SHORT STAY TO GET  READY MORNING OF YOUR SURGERY.  Do not eat food or drink liquids :After Midnight.     Take these medicines the morning of surgery with A SIP OF WATER: NONE                                You may not have any metal on your body including hair pins and              piercings  Do not wear jewelry, make-up, lotions, powders or perfumes, deodorant             Do not wear nail polish.  Do not shave  48 hours prior to surgery.              Do not bring valuables to the hospital. [place] IS NOT             RESPONSIBLE   FOR VALUABLES.  Contacts, dentures or bridgework may not be worn into surgery.    Patients discharged the day of surgery will not be allowed to drive home.  Name and phone number of your driver:  Special Instructions: N/A              Please read over the following fact sheets you were given: _____________________________________________________________________             [facility] - Preparing for [facility] Before surgery, you can play an important role.  Because skin is not sterile, your skin needs to be as free of germs as possible.  You can reduce the number of germs on your skin by washing with CHG (chlorahexidine gluconate) soap before surgery.  CHG is an antiseptic cleaner which kills germs and bonds with the skin to continue killing germs even after washing. Please DO NOT use if you have an allergy to CHG or antibacterial soaps.  If your skin becomes reddened/irritated stop using the CHG and inform your nurse when you arrive at Short Stay. Do not shave (including legs and underarms) for at least 48 hours prior to the first CHG  shower.  You may shave your face/neck. Please follow these instructions carefully:  1.  Shower with CHG Soap the night before surgery and the  morning of Surgery.  2.  If you choose to wash your hair, wash your hair first as usual with your  normal  shampoo.  3.  After you shampoo, rinse your hair and body thoroughly to remove the  shampoo.                           4.  Use CHG as you would any other liquid soap.  You can apply chg directly  to the skin and wash                       Gently with a scrungie or clean washcloth.  5.  Apply the CHG Soap to your body ONLY FROM THE NECK DOWN.   Do not use on face/ open                           Wound or open sores. Avoid contact with eyes, ears mouth and genitals (private parts).                       Wash face,  Genitals (private parts) with your normal soap.             6.  Wash thoroughly, paying special attention to the area where your surgery  will be performed.  7.  Thoroughly rinse your body with warm water from the neck down.  8.  DO NOT shower/wash with your normal soap after using and rinsing off  the CHG Soap.                9.  Pat yourself dry with a clean towel.            10.  Wear clean pajamas.            11.  Place clean sheets on your bed the night of your first shower and do not  sleep with pets. Day of Surgery : Do not apply any lotions/deodorants the morning of surgery.  Please wear clean clothes to the hospital/surgery center.  FAILURE TO FOLLOW THESE INSTRUCTIONS MAY RESULT IN THE CANCELLATION OF YOUR SURGERY PATIENT SIGNATURE_________________________________  NURSE SIGNATURE__________________________________  ________________________________________________________________________

## 2016-05-05 ENCOUNTER — Encounter (HOSPITAL_COMMUNITY)
Admission: RE | Admit: 2016-05-05 | Discharge: 2016-05-05 | Disposition: A | Discharge status: Home / Self Care | Payer: Managed Care, Other (non HMO) | Source: Ambulatory Visit | Attending: Surgery | Admitting: Surgery

## 2016-05-05 ENCOUNTER — Encounter (HOSPITAL_COMMUNITY): Payer: Self-pay

## 2016-05-05 ENCOUNTER — Encounter (INDEPENDENT_AMBULATORY_CARE_PROVIDER_SITE_OTHER): Payer: Self-pay

## 2016-05-05 DIAGNOSIS — K436 Other and unspecified ventral hernia with obstruction, without gangrene: Secondary | ICD-10-CM | POA: Diagnosis not present

## 2016-05-05 DIAGNOSIS — K439 Ventral hernia without obstruction or gangrene: Secondary | ICD-10-CM | POA: Diagnosis present

## 2016-05-05 HISTORY — DX: Family history of other specified conditions: Z84.89

## 2016-05-05 HISTORY — DX: Depression, unspecified: F32.A

## 2016-05-05 HISTORY — DX: Major depressive disorder, single episode, unspecified: F32.9

## 2016-05-05 HISTORY — DX: Diverticulitis of intestine, part unspecified, without perforation or abscess without bleeding: K57.92

## 2016-05-05 HISTORY — DX: Anxiety disorder, unspecified: F41.9

## 2016-05-05 LAB — CBC
HEMATOCRIT: 40.1 % (ref 36.0–46.0)
HEMOGLOBIN: 13.7 g/dL (ref 12.0–15.0)
MCH: 30.4 pg (ref 26.0–34.0)
MCHC: 34.2 g/dL (ref 30.0–36.0)
MCV: 89.1 fL (ref 78.0–100.0)
Platelets: 251 10*3/uL (ref 150–400)
RBC: 4.5 MIL/uL (ref 3.87–5.11)
RDW: 13.4 % (ref 11.5–15.5)
WBC: 6.4 10*3/uL (ref 4.0–10.5)

## 2016-05-05 LAB — HCG, SERUM, QUALITATIVE: PREG SERUM: NEGATIVE

## 2016-05-05 NOTE — Progress Notes (Signed)
CT abd and pelvis1/22/18 epic

## 2016-05-05 NOTE — Progress Notes (Signed)
FYI pt. At preop appt and had paperwork from [facility] that said umbilical hernia repair and order in Epic stated epigastric hernia . Pt. Was hesitant to sign consent without clarification . Agreed to sign consent morning of surgery after clarification by phone call or in person. Thank You

## 2016-05-08 ENCOUNTER — Encounter (HOSPITAL_COMMUNITY)
Admission: RE | Disposition: A | Discharge status: Home / Self Care | Payer: Self-pay | Source: Ambulatory Visit | Attending: Surgery

## 2016-05-08 ENCOUNTER — Ambulatory Visit (HOSPITAL_COMMUNITY)
Admission: RE | Admit: 2016-05-08 | Discharge: 2016-05-08 | Disposition: A | Discharge status: Home / Self Care | Payer: Managed Care, Other (non HMO) | Source: Ambulatory Visit | Attending: Surgery | Admitting: Surgery

## 2016-05-08 ENCOUNTER — Ambulatory Visit (HOSPITAL_COMMUNITY): Payer: Managed Care, Other (non HMO) | Admitting: Certified Registered Nurse Anesthetist

## 2016-05-08 ENCOUNTER — Encounter (HOSPITAL_COMMUNITY): Payer: Self-pay | Admitting: *Deleted

## 2016-05-08 DIAGNOSIS — K436 Other and unspecified ventral hernia with obstruction, without gangrene: Secondary | ICD-10-CM | POA: Insufficient documentation

## 2016-05-08 HISTORY — PX: EPIGASTRIC HERNIA REPAIR: SHX404

## 2016-05-08 SURGERY — REPAIR, HERNIA, EPIGASTRIC, ADULT
Anesthesia: General

## 2016-05-08 MED ORDER — SUGAMMADEX SODIUM 200 MG/2ML IV SOLN
INTRAVENOUS | Status: AC
  Filled 2016-05-08: qty 2

## 2016-05-08 MED ORDER — DOCUSATE SODIUM 100 MG PO CAPS: 100.0000 mg | ORAL_CAPSULE | Freq: Two times a day (BID) | ORAL | 0 refills | Status: AC

## 2016-05-08 MED ORDER — ACETAMINOPHEN 500 MG PO TABS
1000.0000 mg | ORAL_TABLET | ORAL | Status: AC
  Administered 2016-05-08: 1000 mg via ORAL
  Filled 2016-05-08: qty 2

## 2016-05-08 MED ORDER — OXYCODONE HCL 5 MG PO TABS: 5.0000 mg | ORAL_TABLET | ORAL | Status: DC | PRN

## 2016-05-08 MED ORDER — DEXAMETHASONE SODIUM PHOSPHATE 10 MG/ML IJ SOLN
INTRAMUSCULAR | Status: DC | PRN
  Administered 2016-05-08: 10 mg via INTRAVENOUS

## 2016-05-08 MED ORDER — LIDOCAINE 2% (20 MG/ML) 5 ML SYRINGE
INTRAMUSCULAR | Status: AC
  Filled 2016-05-08: qty 5

## 2016-05-08 MED ORDER — FENTANYL CITRATE (PF) 100 MCG/2ML IJ SOLN: 25.0000 ug | INTRAMUSCULAR | Status: DC | PRN

## 2016-05-08 MED ORDER — MIDAZOLAM HCL 5 MG/5ML IJ SOLN
INTRAMUSCULAR | Status: DC | PRN
  Administered 2016-05-08: 2 mg via INTRAVENOUS

## 2016-05-08 MED ORDER — HYDROCODONE-ACETAMINOPHEN 5-325 MG PO TABS: 1.0000 | ORAL_TABLET | Freq: Four times a day (QID) | ORAL | 0 refills | Status: AC | PRN

## 2016-05-08 MED ORDER — DEXAMETHASONE SODIUM PHOSPHATE 10 MG/ML IJ SOLN
INTRAMUSCULAR | Status: AC
  Filled 2016-05-08: qty 1

## 2016-05-08 MED ORDER — PROPOFOL 10 MG/ML IV BOLUS
INTRAVENOUS | Status: DC | PRN
  Administered 2016-05-08: 160 mg via INTRAVENOUS

## 2016-05-08 MED ORDER — SODIUM CHLORIDE 0.9% FLUSH: 3.0000 mL | Freq: Two times a day (BID) | INTRAVENOUS | Status: DC

## 2016-05-08 MED ORDER — CELECOXIB 200 MG PO CAPS
400.0000 mg | ORAL_CAPSULE | ORAL | Status: AC
  Administered 2016-05-08: 400 mg via ORAL
  Filled 2016-05-08: qty 2

## 2016-05-08 MED ORDER — FENTANYL CITRATE (PF) 100 MCG/2ML IJ SOLN
INTRAMUSCULAR | Status: DC | PRN
  Administered 2016-05-08 (×2): 50 ug via INTRAVENOUS
  Administered 2016-05-08: 100 ug via INTRAVENOUS
  Administered 2016-05-08: 50 ug via INTRAVENOUS

## 2016-05-08 MED ORDER — BUPIVACAINE HCL (PF) 0.25 % IJ SOLN
INTRAMUSCULAR | Status: AC
  Filled 2016-05-08: qty 30

## 2016-05-08 MED ORDER — LIDOCAINE 2% (20 MG/ML) 5 ML SYRINGE
INTRAMUSCULAR | Status: DC | PRN
  Administered 2016-05-08: 60 mg via INTRAVENOUS

## 2016-05-08 MED ORDER — MIDAZOLAM HCL 2 MG/2ML IJ SOLN
INTRAMUSCULAR | Status: AC
  Filled 2016-05-08: qty 2

## 2016-05-08 MED ORDER — CEFAZOLIN SODIUM-DEXTROSE 2-4 GM/100ML-% IV SOLN
2.0000 g | INTRAVENOUS | Status: AC
Start: 2016-05-08 — End: 2016-05-08
  Administered 2016-05-08: 2 g via INTRAVENOUS

## 2016-05-08 MED ORDER — GABAPENTIN 300 MG PO CAPS
300.0000 mg | ORAL_CAPSULE | ORAL | Status: AC
  Administered 2016-05-08: 300 mg via ORAL
  Filled 2016-05-08: qty 1

## 2016-05-08 MED ORDER — SODIUM CHLORIDE 0.9 % IV SOLN: 250.0000 mL | INTRAVENOUS | Status: DC | PRN

## 2016-05-08 MED ORDER — ACETAMINOPHEN 650 MG RE SUPP
650.0000 mg | RECTAL | Status: DC | PRN
Start: 2016-05-08 — End: 2016-05-08

## 2016-05-08 MED ORDER — 0.9 % SODIUM CHLORIDE (POUR BTL) OPTIME
TOPICAL | Status: DC | PRN
  Administered 2016-05-08: 1000 mL

## 2016-05-08 MED ORDER — PROPOFOL 10 MG/ML IV BOLUS
INTRAVENOUS | Status: AC
  Filled 2016-05-08: qty 20

## 2016-05-08 MED ORDER — BUPIVACAINE HCL (PF) 0.25 % IJ SOLN
INTRAMUSCULAR | Status: DC | PRN
  Administered 2016-05-08: 20 mL

## 2016-05-08 MED ORDER — LIP MEDEX EX OINT
TOPICAL_OINTMENT | CUTANEOUS | Status: AC
  Filled 2016-05-08: qty 7

## 2016-05-08 MED ORDER — ROCURONIUM BROMIDE 10 MG/ML (PF) SYRINGE
PREFILLED_SYRINGE | INTRAVENOUS | Status: DC | PRN
  Administered 2016-05-08: 40 mg via INTRAVENOUS

## 2016-05-08 MED ORDER — SUGAMMADEX SODIUM 200 MG/2ML IV SOLN
INTRAVENOUS | Status: DC | PRN
  Administered 2016-05-08: 200 mg via INTRAVENOUS

## 2016-05-08 MED ORDER — SODIUM CHLORIDE 0.9% FLUSH: 3.0000 mL | INTRAVENOUS | Status: DC | PRN

## 2016-05-08 MED ORDER — CEFAZOLIN SODIUM-DEXTROSE 2-4 GM/100ML-% IV SOLN
INTRAVENOUS | Status: AC
  Filled 2016-05-08: qty 100

## 2016-05-08 MED ORDER — LACTATED RINGERS IV SOLN
INTRAVENOUS | Status: DC | PRN
  Administered 2016-05-08 (×2): via INTRAVENOUS

## 2016-05-08 MED ORDER — FENTANYL CITRATE (PF) 250 MCG/5ML IJ SOLN
INTRAMUSCULAR | Status: AC
  Filled 2016-05-08: qty 5

## 2016-05-08 MED ORDER — HYDROMORPHONE HCL 1 MG/ML IJ SOLN: 0.2500 mg | INTRAMUSCULAR | Status: DC | PRN

## 2016-05-08 MED ORDER — ONDANSETRON HCL 4 MG/2ML IJ SOLN
INTRAMUSCULAR | Status: DC | PRN
  Administered 2016-05-08: 4 mg via INTRAVENOUS

## 2016-05-08 MED ORDER — ACETAMINOPHEN 325 MG PO TABS: 650.0000 mg | ORAL_TABLET | ORAL | Status: DC | PRN

## 2016-05-08 MED ORDER — ROCURONIUM BROMIDE 50 MG/5ML IV SOSY
PREFILLED_SYRINGE | INTRAVENOUS | Status: AC
  Filled 2016-05-08: qty 5

## 2016-05-08 MED ORDER — ONDANSETRON HCL 4 MG/2ML IJ SOLN
INTRAMUSCULAR | Status: AC
  Filled 2016-05-08: qty 2

## 2016-05-08 SURGICAL SUPPLY — 29 items
ADH SKN CLS APL DERMABOND .7 (GAUZE/BANDAGES/DRESSINGS) ×1
APL SKNCLS STERI-STRIP NONHPOA (GAUZE/BANDAGES/DRESSINGS)
BENZOIN TINCTURE PRP APPL 2/3 (GAUZE/BANDAGES/DRESSINGS) IMPLANT
CHLORAPREP W/TINT 26ML (MISCELLANEOUS) ×3 IMPLANT
CLOSURE WOUND 1/2 X4 (GAUZE/BANDAGES/DRESSINGS)
COVER SURGICAL LIGHT HANDLE (MISCELLANEOUS) ×3 IMPLANT
DECANTER SPIKE VIAL GLASS SM (MISCELLANEOUS) ×3 IMPLANT
DERMABOND ADVANCED (GAUZE/BANDAGES/DRESSINGS) ×2
DERMABOND ADVANCED .7 DNX12 (GAUZE/BANDAGES/DRESSINGS) IMPLANT
DRAPE LAPAROSCOPIC ABDOMINAL (DRAPES) ×3 IMPLANT
ELECT REM PT RETURN 9FT ADLT (ELECTROSURGICAL) ×3
ELECTRODE REM PT RTRN 9FT ADLT (ELECTROSURGICAL) ×1 IMPLANT
GAUZE SPONGE 4X4 12PLY STRL (GAUZE/BANDAGES/DRESSINGS) IMPLANT
GLOVE BIO SURGEON STRL SZ 6 (GLOVE) ×3 IMPLANT
GLOVE INDICATOR 6.5 STRL GRN (GLOVE) ×3 IMPLANT
GOWN STRL REUS W/TWL LRG LVL3 (GOWN DISPOSABLE) ×3 IMPLANT
GOWN STRL REUS W/TWL XL LVL3 (GOWN DISPOSABLE) ×3 IMPLANT
KIT BASIN OR (CUSTOM PROCEDURE TRAY) ×3 IMPLANT
NEEDLE HYPO 22GX1.5 SAFETY (NEEDLE) ×4 IMPLANT
PACK GENERAL/GYN (CUSTOM PROCEDURE TRAY) ×3 IMPLANT
STRIP CLOSURE SKIN 1/2X4 (GAUZE/BANDAGES/DRESSINGS) IMPLANT
SUT ETHIBOND 0 MO6 C/R (SUTURE) ×3 IMPLANT
SUT MNCRL AB 4-0 PS2 18 (SUTURE) ×3 IMPLANT
SUT PROLENE 2 0 CT2 30 (SUTURE) IMPLANT
SUT VIC AB 3-0 SH 27 (SUTURE) ×3
SUT VIC AB 3-0 SH 27XBRD (SUTURE) ×1 IMPLANT
SYR CONTROL 10ML LL (SYRINGE) ×2 IMPLANT
TOWEL OR 17X26 10 PK STRL BLUE (TOWEL DISPOSABLE) ×3 IMPLANT
TOWEL OR NON WOVEN STRL DISP B (DISPOSABLE) ×3 IMPLANT

## 2016-05-08 NOTE — Anesthesia Procedure Notes (Signed)
Procedure Name: Intubation Date/Time: 05/08/2016 7:24 AM Performed by: Jhonnie GarnerMARSHALL, Armonii Sieh M Pre-anesthesia Checklist: Patient identified, Emergency Drugs available, Suction available and Patient being monitored Patient Re-evaluated:Patient Re-evaluated prior to inductionOxygen Delivery Method: Circle system utilized Preoxygenation: Pre-oxygenation with 100% oxygen Intubation Type: IV induction Ventilation: Mask ventilation without difficulty Laryngoscope Size: Miller and 2 Grade View: Grade I Tube type: Oral Tube size: 7.5 mm Number of attempts: 1 Airway Equipment and Method: Stylet Placement Confirmation: ETT inserted through vocal cords under direct vision,  positive ETCO2 and breath sounds checked- equal and bilateral Secured at: 21 cm Tube secured with: Tape Dental Injury: Teeth and Oropharynx as per pre-operative assessment

## 2016-05-08 NOTE — Transfer of Care (Signed)
Immediate Anesthesia Transfer of Care Note  Patient: Renee Hood  Procedure(s) Performed: Procedure(s): HERNIA REPAIR EPIGASTRIC ADULT (N/A)  Patient Location: PACU  Anesthesia Type:General  Level of Consciousness: awake, alert  and oriented  Airway & Oxygen Therapy: Patient Spontanous Breathing and Patient connected to face mask oxygen  Post-op Assessment: Report given to RN and Post -op Vital signs reviewed and stable  Post vital signs: Reviewed and stable  Last Vitals:  Vitals:   05/08/16 0527  BP: 125/85  Pulse: 92  Resp: 16  Temp: 36.8 C    Last Pain:  Vitals:   05/08/16 0527  TempSrc: Oral         Complications: No apparent anesthesia complications

## 2016-05-08 NOTE — Op Note (Signed)
Operative Note  Kandis BanLori C Coughlin  [id number]  [id number]  05/08/2016   Surgeon: Berna Buehelsea A Connor  Assistant: none  Procedure performed: open primary repair of chronically incarcerated epigastric hernia  Preop diagnosis: chronically incarcerated epigastric hernia Post-op diagnosis/intraop findings: same  Specimens: none Retained items: none EBL: <5cc Complications: none  Description of procedure: After obtaining informed consent the patient was taken to the operating room and placed supine on operating room table wheregeneral endotracheal anesthesia was initiated, preoperative antibiotics were administered, SCDs applied, and a formal timeout was performed. The abdomen was prepped and draped in the usual sterile fashion. A 3cm vertical incision was made in the midline just above the umbilicus and the soft tissues dissected using cautery and blunt dissection until the hernia defect and chronically incarcerated fat were exposed. The fat and sac were excised using cautery and the remaining sac was reduced into the preperitoneal space. The fascial defect was about 1cm in diameter. This was closed with interrupted 0-ethibonds. Hemostasis was ensured within the wound with cautery. The wound was then infiltrated with local anesthetic and closed with 3-0 vicryl deep dermal sutures and a running subcuticular monocryl and liquiban. The patient was then awakened, extubated and taken to PACU in stable condition.   All counts were correct at the completion of the case.

## 2016-05-08 NOTE — H&P (View-Only) (Signed)
Alexa Blish 04/19/2016 3:13 PM Location: [facility] Patient #: [id number] DOB: [birth year] Divorced / Language: [place] / Race: White Female  History of Present Illness (Chelsea A. Fredricka Bonine MD; 04/19/2016 4:08 PM) Patient words: Otherwise healthy 41 year old woman with a long-standing history of an epigastric hernia. She initially noticed it as a lump above her umbilicus around 15 years ago while she was pregnant. It was previously asymptomatic, but in the last few months she has had the occasional twinge of discomfort in the area. Denies nausea, vomiting, or increased size of the hernia. A few weeks ago she was however, diagnosed with mild uncompensated diverticulitis which had caused her some left lower quadrant and flank pain. She had been seen by Dr. Michaell Cowing a few years ago and was slated to undergo hernia repair at that time but had kept putting it off until now, and she feels that she is ready to have surgery.  Prior abdominal surgeries include C-sections.  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 She graduated from [employer] and lived in [place] for about 10 years before moving up here.  The patient is a 41 year old female.   Past Surgical History Timmothy Euler, [place]; 04/19/2016 3:14 PM) Breast Augmentation Bilateral. Cesarean Section - Multiple Hip Surgery Left. Oral Surgery  Diagnostic Studies History Timmothy Euler, [place]; 04/19/2016 3:14 PM) Colonoscopy never Mammogram never Pap Smear 1-5 years ago  Allergies Timmothy Euler, CMA; 04/19/2016 3:14 PM) Sulfacetamide *CHEMICALS* Anaphylaxis. Allergies Reconciled  Medication History Timmothy Euler, [place]; 04/19/2016 3:14 PM) No Current Medications Medications Reconciled  Social History Timmothy Euler, [place]; 04/19/2016 3:14 PM) Alcohol use Occasional alcohol use. Caffeine use Coffee. No drug use Tobacco use Never smoker.  Family History Timmothy Euler, [place]; 04/19/2016  3:14 PM) Alcohol Abuse Father. Anesthetic complications Daughter. Ischemic Bowel Disease Father.  Pregnancy / Birth History Timmothy Euler, [place]; 04/19/2016 3:14 PM) Age at menarche 13 years. Contraceptive History Depo-provera, Intrauterine device, Oral contraceptives. Gravida 4 Maternal age 64-25 Para 2 Regular periods  Other Problems Timmothy Euler, [place]; 04/19/2016 3:14 PM) Anxiety Disorder Depression Diverticulosis     Review of Systems Timmothy Euler CMA; 04/19/2016 3:14 PM) General Not Present- Appetite Loss, Chills, Fatigue, Fever, Night Sweats, Weight Gain and Weight Loss. Skin Not Present- Change in Wart/Mole, Dryness, Hives, Jaundice, New Lesions, Non-Healing Wounds, Rash and Ulcer. HEENT Not Present- Earache, Hearing Loss, Hoarseness, Nose Bleed, Oral Ulcers, Ringing in the Ears, Seasonal Allergies, Sinus Pain, Sore Throat, Visual Disturbances, Wears glasses/contact lenses and Yellow Eyes. Respiratory Not Present- Bloody sputum, Chronic Cough, Difficulty Breathing, Snoring and Wheezing. Breast Not Present- Breast Mass, Breast Pain, Nipple Discharge and Skin Changes. Cardiovascular Not Present- Chest Pain, Difficulty Breathing Lying Down, Leg Cramps, Palpitations, Rapid Heart Rate, Shortness of Breath and Swelling of Extremities. Gastrointestinal Present- Gets full quickly at meals. Not Present- Abdominal Pain, Bloating, Bloody Stool, Change in Bowel Habits, Chronic diarrhea, Constipation, Difficulty Swallowing, Excessive gas, Hemorrhoids, Indigestion, Nausea, Rectal Pain and Vomiting. Female Genitourinary Not Present- Frequency, Nocturia, Painful Urination, Pelvic Pain and Urgency. Musculoskeletal Not Present- Back Pain, Joint Pain, Joint Stiffness, Muscle Pain, Muscle Weakness and Swelling of Extremities. Neurological Not Present- Decreased Memory, Fainting, Headaches, Numbness, Seizures, Tingling, Tremor, Trouble walking and Weakness. Psychiatric Not Present- Anxiety,  Bipolar, Change in Sleep Pattern, Depression, Fearful and Frequent crying. Hematology Present- Easy Bruising. Not Present- Blood Thinners, Excessive bleeding, Gland problems, HIV and Persistent Infections.  Vitals Barron Alvine [place] CMA; 04/19/2016  3:15 PM) 04/19/2016 3:14 PM Weight: 143.6 lb Height: 64in Body Surface Area: 1.7 m Body Mass Index: 24.65 kg/m  Temp.: 98.32F  Pulse: 87 (Regular)  BP: 122/74 (Sitting, Left Arm, Standard)      Physical Exam (Chelsea A. Fredricka Bonineonnor MD; 04/19/2016 4:05 PM)  General Note: alert, well-appearing  Integumentary Note: no lesions or rashes on limited skin exam  Head and Neck Note: normocephalic, neck without mass or thyromegaly  Eye Note: anicteric, Ectraocular motion intact  ENMT Note: moist mucus membranes, good dentition  Chest and Lung Exam Note: unlabored respirations, symmetrical air entry  Cardiovascular Note: regular rate and rhythm, palpable distal pulses, no pedal edema  Abdomen Note: soft, nontender, nondistended. Small amount of incarcerated fat about 3cm superior to umbilicus and just to the right of midline, fascial defect not palpable. no organomegaly  Neurologic Note: grossly normal, normal gait  Neuropsychiatric Note: normal mood and affect, appropriate insight  Musculoskeletal Note: strength symmetrical throughout, no deformity    Assessment & Plan (Chelsea A. 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 Discussed risks of bleeding, pain, scarring, wound infection, hernia recurrence, intraabdominal injury especially in the case of laparoscopic surgery. She and her husband asked insightful and appropriate questions. Ultimately decided on open repair. Will plan for surgery in the coming weeks.

## 2016-05-08 NOTE — Discharge Instructions (Signed)
General Anesthesia, Adult, Care After These instructions provide you with information about caring for yourself after your procedure. Your health care provider may also give you more specific instructions. Your treatment has been planned according to current medical practices, but problems sometimes occur. Call your health care provider if you have any problems or questions after your procedure. What can I expect after the procedure? After the procedure, it is common to have:  Vomiting.  A sore throat.  Mental slowness. It is common to feel:  Nauseous.  Cold or shivery.  Sleepy.  Tired.  Sore or achy, even in parts of your body where you did not have surgery. Follow these instructions at home: For at least 24 hours after the procedure:  Do not:  Participate in activities where you could fall or become injured.  Drive.  Use heavy machinery.  Drink alcohol.  Take sleeping pills or medicines that cause drowsiness.  Make important decisions or sign legal documents.  Take care of children on your own.  Rest. Eating and drinking  If you vomit, drink water, juice, or soup when you can drink without vomiting.  Drink enough fluid to keep your urine clear or pale yellow.  Make sure you have little or no nausea before eating solid foods.  Follow the diet recommended by your health care provider. General instructions  Have a responsible adult stay with you until you are awake and alert.  Return to your normal activities as told by your health care provider. Ask your health care provider what activities are safe for you.  Take over-the-counter and prescription medicines only as told by your health care provider.  If you smoke, do not smoke without supervision.  Keep all follow-up visits as told by your health care provider. This is important. Contact a health care provider if:  You continue to have nausea or vomiting at home, and medicines are not helpful.  You  cannot drink fluids or start eating again.  You cannot urinate after 8-12 hours.  You develop a skin rash.  You have fever.  You have increasing redness at the site of your procedure. Get help right away if:  You have difficulty breathing.  You have chest pain.  You have unexpected bleeding.  You feel that you are having a life-threatening or urgent problem. This information is not intended to replace advice given to you by your health care provider. Make sure you discuss any questions you have with your health care provider. Document Released: 06/05/2000 Document Revised: 08/02/2015 Document Reviewed: 02/11/2015 Elsevier Interactive Patient Education  2017 [employer] CCS _______[facility]  HERNIA REPAIR: POST OP INSTRUCTIONS  Always review your discharge instruction sheet given to you by the facility where your surgery was performed. IF YOU HAVE DISABILITY OR FAMILY LEAVE FORMS, YOU MUST BRING THEM TO THE OFFICE FOR PROCESSING.   DO NOT GIVE THEM TO YOUR DOCTOR.  1. A  prescription for pain medication may be given to you upon discharge.  Take your pain medication as prescribed, if needed.  If narcotic pain medicine is not needed, then you may take acetaminophen (Tylenol) or ibuprofen (Advil) as needed. 2. Take your usually prescribed medications unless otherwise directed. If you need a refill on your pain medication, please contact your pharmacy.  They will contact our office to request authorization. Prescriptions will not be filled after 5 pm or on week-ends. 3. You should follow a light diet the first 24 hours after arrival home, such as  soup and crackers, etc.  Be sure to include lots of fluids daily.  Resume your normal diet the day after surgery. 4.Most patients will experience some swelling and bruising around incision.  Ice packs and reclining will help.  Swelling and bruising can take several days to resolve.  6. It is common to experience some  constipation if taking pain medication after surgery.  Increasing fluid intake and taking a stool softener (such as Colace) will usually help or prevent this problem from occurring.  A mild laxative (Milk of Magnesia or Miralax) should be taken according to package directions if there are no bowel movements after 48 hours. 7. Your surgeon used skin glue on the incision; you may shower in 24 hours.  The glue will flake off over the next 2-3 weeks.  There are no sutures or staples that need to be removed. 8. ACTIVITIES:  You may resume regular (light) daily activities beginning the next day--such as daily self-care, walking, climbing stairs--gradually increasing activities as tolerated.  You may have sexual intercourse when it is comfortable.  Refrain from any heavy lifting or straining until approved by your doctor.  a.You may drive when you are no longer taking prescription pain medication, you can comfortably wear a seatbelt, and you can safely maneuver your car and apply brakes. b.RETURN TO WORK:  1 week _____________________________________________  9.You should see your doctor in the office for a follow-up appointment approximately 2-3 weeks after your surgery.  Make sure that you call for this appointment within a day or two after you arrive home to insure a convenient appointment time. 10.OTHER INSTRUCTIONS: _________________________    _____________________________________  WHEN TO CALL YOUR DOCTOR: 1. Fever over 101.0 2. Inability to urinate 3. Nausea and/or vomiting 4. Extreme swelling or bruising 5. Continued bleeding from incision. 6. Increased pain, redness, or drainage from the incision  The clinic staff is available to answer your questions during regular business hours.  Please dont hesitate to call and ask to speak to one of the nurses for clinical concerns.  If you have a medical emergency, go to the nearest emergency room or call 911.  A surgeon from [facility] is  always on call at the hospital   [address], [place], [place]  [address] ?  [address] [address], [place], [place]   [address] [phone] ? [phone] ? FAX [phone] Web site: [email or web address]

## 2016-05-08 NOTE — Interval H&P Note (Signed)
History and Physical Interval Note:  05/08/2016 7:02 AM  Renee Hood  has presented today for surgery, with the diagnosis of Epigastric hernia  The various methods of treatment have been discussed with the patient and family. After consideration of risks, benefits and other options for treatment, the patient has consented to  Procedure(s): HERNIA REPAIR EPIGASTRIC ADULT (N/A) as a surgical intervention .  The patient's history has been reviewed, patient examined, no change in status, stable for surgery.  I have reviewed the patient's chart and labs.  Questions were answered to the patient's satisfaction.     Nevaan Bunton Lollie SailsA Nataki Mccrumb

## 2016-05-08 NOTE — Anesthesia Preprocedure Evaluation (Addendum)
Anesthesia Evaluation  Patient identified by MRN, date of birth, ID band Patient awake    Reviewed: Allergy & Precautions, NPO status , reviewed documented beta blocker date and time   Airway Mallampati: II  TM Distance: >3 FB     Dental   Pulmonary neg pulmonary ROS,    breath sounds clear to auscultation       Cardiovascular negative cardio ROS   Rhythm:Regular Rate:Normal     Neuro/Psych    GI/Hepatic negative GI ROS, Neg liver ROS,   Endo/Other    Renal/GU negative Renal ROS     Musculoskeletal   Abdominal   Peds  Hematology   Anesthesia Other Findings   Reproductive/Obstetrics                            Anesthesia Physical Anesthesia Plan  ASA: II  Anesthesia Plan: General   Post-op Pain Management:    Induction: Intravenous  Airway Management Planned: Oral ETT  Additional Equipment:   Intra-op Plan:   Post-operative Plan: Extubation in OR  Informed Consent: I have reviewed the patients History and Physical, chart, labs and discussed the procedure including the risks, benefits and alternatives for the proposed anesthesia with the patient or authorized representative who has indicated his/her understanding and acceptance.   Dental advisory given  Plan Discussed with: CRNA and Anesthesiologist  Anesthesia Plan Comments:        Anesthesia Quick Evaluation

## 2016-05-08 NOTE — Anesthesia Postprocedure Evaluation (Signed)
Anesthesia Post Note  Patient: Renee Hood  Procedure(s) Performed: Procedure(s) (LRB): HERNIA REPAIR EPIGASTRIC ADULT (N/A)  Patient location during evaluation: PACU Anesthesia Type: General Level of consciousness: awake Pain management: pain level controlled Respiratory status: spontaneous breathing Cardiovascular status: stable Anesthetic complications: no       Last Vitals:  Vitals:   05/08/16 0830 05/08/16 0845  BP: 112/68   Pulse: (!) 58 69  Resp: 14 19  Temp:      Last Pain:  Vitals:   05/08/16 0816  TempSrc:   PainSc: 0-No pain                 Ginevra Tacker

## 2017-10-03 ENCOUNTER — Other Ambulatory Visit: Payer: Self-pay | Admitting: Family Medicine

## 2017-10-03 ENCOUNTER — Other Ambulatory Visit (HOSPITAL_COMMUNITY)
Admission: RE | Admit: 2017-10-03 | Discharge: 2017-10-03 | Disposition: A | Discharge status: Home / Self Care | Payer: Managed Care, Other (non HMO) | Source: Ambulatory Visit | Attending: Family Medicine | Admitting: Family Medicine

## 2017-10-03 DIAGNOSIS — Z124 Encounter for screening for malignant neoplasm of cervix: Secondary | ICD-10-CM | POA: Diagnosis not present

## 2017-10-10 LAB — CYTOLOGY - PAP
Diagnosis: NEGATIVE
HPV: NOT DETECTED

## 2018-01-07 ENCOUNTER — Other Ambulatory Visit: Payer: Self-pay | Admitting: Orthopedic Surgery

## 2018-01-07 DIAGNOSIS — M25552 Pain in left hip: Secondary | ICD-10-CM

## 2018-01-09 ENCOUNTER — Other Ambulatory Visit: Payer: Managed Care, Other (non HMO)

## 2018-01-11 ENCOUNTER — Ambulatory Visit
Admission: RE | Admit: 2018-01-11 | Discharge: 2018-01-11 | Disposition: A | Discharge status: Home / Self Care | Payer: Managed Care, Other (non HMO) | Source: Ambulatory Visit | Attending: Orthopedic Surgery | Admitting: Orthopedic Surgery

## 2018-01-11 DIAGNOSIS — M25552 Pain in left hip: Secondary | ICD-10-CM

## 2018-01-16 ENCOUNTER — Other Ambulatory Visit: Payer: Self-pay | Admitting: Orthopedic Surgery

## 2018-01-16 DIAGNOSIS — M25552 Pain in left hip: Secondary | ICD-10-CM

## 2018-01-22 ENCOUNTER — Ambulatory Visit
Admission: RE | Admit: 2018-01-22 | Discharge: 2018-01-22 | Disposition: A | Discharge status: Home / Self Care | Payer: Managed Care, Other (non HMO) | Source: Ambulatory Visit | Attending: Orthopedic Surgery | Admitting: Orthopedic Surgery

## 2018-01-22 DIAGNOSIS — M25552 Pain in left hip: Secondary | ICD-10-CM

## 2018-02-25 ENCOUNTER — Other Ambulatory Visit: Payer: Self-pay

## 2018-02-25 ENCOUNTER — Encounter: Payer: Self-pay | Admitting: Physical Therapy

## 2018-02-25 ENCOUNTER — Ambulatory Visit: Payer: Managed Care, Other (non HMO) | Attending: Orthopedic Surgery | Admitting: Physical Therapy

## 2018-02-25 DIAGNOSIS — M25652 Stiffness of left hip, not elsewhere classified: Secondary | ICD-10-CM | POA: Diagnosis present

## 2018-02-25 DIAGNOSIS — M6281 Muscle weakness (generalized): Secondary | ICD-10-CM | POA: Insufficient documentation

## 2018-02-25 DIAGNOSIS — R262 Difficulty in walking, not elsewhere classified: Secondary | ICD-10-CM | POA: Insufficient documentation

## 2018-02-25 DIAGNOSIS — M25552 Pain in left hip: Secondary | ICD-10-CM | POA: Diagnosis present

## 2018-02-25 NOTE — Therapy (Signed)
[facility] [address]  [address] [place], [place], [address] Phone: [phone]   Fax:  [phone]  Physical Therapy Evaluation  Patient Details  Name: Renee BanLori C Hood MRN: [id number] Date of Birth: [birth year] Referring Provider (PT): Margarita Ranaimothy Murphy, MD   Encounter Date: 02/25/2018  PT End of Session - 02/25/18 1227    Visit Number  1    Number of Visits  13    Date for PT Re-Evaluation  04/08/18    Authorization Type  [employer]    PT Start Time  1103    PT Stop Time  1150    PT Time Calculation (min)  47 min    Activity Tolerance  Patient tolerated treatment well    Behavior During Therapy  [facility] for tasks assessed/performed       Past Medical History:  Diagnosis Date  . Anxiety   . Depression   . Diverticulitis   . Family history of adverse reaction to anesthesia    daughter  . No pertinent past medical history     Past Surgical History:  Procedure Laterality Date  . BREAST ENHANCEMENT SURGERY  2006  . CESAREAN SECTION     X2  . DILATION AND EVACUATION  12/28/2010   Procedure: DILATATION AND EVACUATION (D&E);  Surgeon: Tereso NewcomerUgonna A Anyanwu, MD;  Location: [facility];  Service: Gynecology;  Laterality: N/A;  . dislocated hip  21 months   surgery to repair/tn  . EPIGASTRIC HERNIA REPAIR N/A 05/08/2016   Procedure: HERNIA REPAIR EPIGASTRIC ADULT;  Surgeon: Berna Buehelsea A Connor, MD;  Location: [facility];  Service: General;  Laterality: N/A;    There were no vitals filed for this visit.   Subjective Assessment - 02/25/18 1106    Subjective  Patient reports that she underwent anterior L hip replacement on 02/21/18. Discharged from hospital the next day with a rolling walker. Using this consistently outside and at home. Does not own crutches/cane. Reports mild pain since the surgery. Reports MD did not give her any precautions, but has been performing HEP provided to her by the hospital. Wants to work on getting rid of the walker and  walk on her own as well as return to work.  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 Has been having swelling in L lateral hip and has been using ice and ted hose.     Patient is accompained by:  Family member   husband   Pertinent History  diverticulitis, dislocated hip    Limitations  Sitting;House hold activities    How long can you sit comfortably?  1.5 hours    How long can you stand comfortably?  not sure    How long can you walk comfortably?  not sure    Diagnostic tests  01/11/18 L hip CT: moderate OA of L hip, No acute osseous injury of the left hip. 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  Yes    Pain Score  1     Pain Location  Hip    Pain Orientation  Left   "deep to joint"   Pain Descriptors / Indicators  Aching    Pain Type  Acute pain;Surgical pain         [facility] PT Assessment - 02/25/18 1120  Assessment   Medical Diagnosis  S/P L anterior hip replacement    Referring Provider (PT)  Margarita Rana, MD    Onset Date/Surgical Date  02/21/18    Next MD Visit  03/08/2018    Prior Therapy  Yes- long ago      Precautions   Precautions  --   avoid excessive hip ex and ER     Balance Screen   Has the patient fallen in the past 6 months  No    Has the patient had a decrease in activity level because of a fear of falling?   No    Is the patient reluctant to leave their home because of a fear of falling?   No      Home Public house manager residence    Living Arrangements  Spouse/significant other    Available Help at Discharge  Family    Type of Home  House    Home Access  Stairs to enter    Entrance Stairs-Number of Steps  3    Entrance Stairs-Rails  Right    Home Layout  One level    Home Equipment  Walker - 2 wheels      Prior Function   Level of Independence  Independent     Vocation  --   on medical leave   Press photographer- standing, squatting, walking    Leisure  go dancing, get to 8-10k steps a day      Cognition   Overall Cognitive Status  Within Functional Limits for tasks assessed      Observation/Other Assessments   Focus on Therapeutic Outcomes (FOTO)   Hip: 41 (59% limited, 41% predicted)      Sensation   Light Touch  Appears Intact      Coordination   Gross Motor Movements are Fluid and Coordinated  Yes      Posture/Postural Control   Posture/Postural Control  Postural limitations    Postural Limitations  Rounded Shoulders;Posterior pelvic tilt      ROM / Strength   AROM / PROM / Strength  Strength;AROM      AROM   AROM Assessment Site  Hip    Right/Left Hip  Left    Left Hip Flexion  92   AAROM with strap     Strength   Strength Assessment Site  Hip;Knee;Ankle    Right/Left Hip  Right;Left    Right Hip Flexion  4+/5    Right Hip ABduction  4+/5    Right Hip ADduction  4+/5    Left Hip Flexion  3/5    Left Hip ABduction  2+/5   tested isometrically   Left Hip ADduction  2+/5   tested isometrically   Right/Left Knee  Right;Left    Right Knee Flexion  4+/5    Right Knee Extension  4+/5    Left Knee Flexion  4+/5    Left Knee Extension  4+/5    Right/Left Ankle  Right;Left    Right Ankle Dorsiflexion  4+/5    Right Ankle Plantar Flexion  4+/5    Left Ankle Dorsiflexion  4+/5    Left Ankle Plantar Flexion  4+/5      Palpation   Palpation comment  L lateral hip slightly edematous and TTP; incisions not inspected d/ ted hose      Ambulation/Gait   Assistive device  Rolling walker    Gait Pattern  Step-through pattern;Decreased hip/knee flexion -  left;Decreased weight shift to left;Poor foot clearance - left    Ambulation Surface  Level;Indoor    Gait velocity  slightly decreased                Objective measurements completed on examination: See above findings.              PT  Education - 02/25/18 1227    Education Details  prognosis, POC, HEP    Person(s) Educated  Patient;Spouse   husband   Methods  Explanation;Demonstration;Tactile cues;Verbal cues;Handout    Comprehension  Verbalized understanding;Returned demonstration       PT Short Term Goals - 02/25/18 1230      PT SHORT TERM GOAL #1   Title  Patient to be independent with initial HEP.    Time  3    Period  Weeks    Status  New    Target Date  03/18/18        PT Long Term Goals - 02/25/18 1230      PT LONG TERM GOAL #1   Title  Patient to be independent with advanced HEP.    Time  6    Period  Weeks    Status  New    Target Date  04/08/18      PT LONG TERM GOAL #2   Title  Patient to demonstrate >=4+/5 strength in B LEs.    Time  6    Period  Weeks    Status  New    Target Date  04/08/18      PT LONG TERM GOAL #3   Title  Patient to demonstrate L hip AROM symmetrical to oppisite LE and non-painful.    Time  6    Period  Weeks    Status  New    Target Date  04/08/18      PT LONG TERM GOAL #4   Title  Patient to demonstrate symmetrical step length, weight shift, and hio flexion with ambulation with LRAD.    Time  6    Period  Weeks    Status  New    Target Date  04/08/18      PT LONG TERM GOAL #5   Title  Patient to return to 8,000 steps/day without pain limiting.     Time  6    Period  Weeks    Status  New    Target Date  04/08/18             Plan - 02/25/18 1236    Clinical Impression Statement  Patient is a 42y/o F presenting to [facility] with c/o L hip pain after L anterior hip replacement on 02/21/18 d/t congenital hip defect. Reporting mild pain levels since surgery, and walking with rolling walker. Has been having swelling in L lateral hip and has been using ice and ted hose. 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 Patient today with painful and limited L hip ROM, decreased strength, pain, and gait deviations. Educated on gentle  ROM and strengthening HEP and advised to avoid excessive L hip ER and extension. Patient and husband reported understanding. Would benefit from skilled PT services 2x/wee for 6 weeks to address aforementioned impairments.     Clinical Presentation  Stable    Clinical Decision Making  Low    Rehab Potential  Good    Clinical Impairments Affecting Rehab Potential  diverticulitis, dislocated hip    PT Frequency  2x / week    PT Duration  6 weeks    PT Treatment/Interventions  ADLs/Self Care Home Management;Cryotherapy;Electrical Stimulation;Functional mobility training;Stair training;Gait training;DME Instruction;Ultrasound;Moist Heat;Therapeutic activities;Therapeutic exercise;Balance training;Neuromuscular re-education;Patient/family education;Passive range of motion;Scar mobilization;Manual techniques;Dry needling;Energy conservation;Splinting;Taping;Vasopneumatic Device    PT Next Visit Plan  reassess HEP; take ROM measurements of R hip     Consulted and Agree with Plan of Care  Patient       Patient will benefit from skilled therapeutic intervention in order to improve the following deficits and impairments:  Decreased range of motion, Difficulty walking, Decreased activity tolerance, Pain, Decreased balance, Impaired flexibility, Decreased scar mobility, Postural dysfunction, Decreased strength, Increased edema, Improper body mechanics  Visit Diagnosis: Pain in left hip  Stiffness of left hip, not elsewhere classified  Muscle weakness (generalized)  Difficulty in walking, not elsewhere classified     Problem List Patient Active Problem List   Diagnosis Date Noted  . Incarcerated epigastric hernia 07/17/2012  . Abortion, missed 02/14/2011    Anette Guarneri, PT, DPT 02/25/18 1:09 PM   [facility] [address]  [address] [place], [place], [address] Phone: [phone]   Fax:  [phone]  Name: Renee Hood MRN:  [id number] Date of Birth: 07/18/1975

## 2018-03-01 ENCOUNTER — Encounter: Payer: Self-pay | Admitting: Physical Therapy

## 2018-03-01 ENCOUNTER — Ambulatory Visit: Payer: Managed Care, Other (non HMO) | Admitting: Physical Therapy

## 2018-03-01 DIAGNOSIS — M6281 Muscle weakness (generalized): Secondary | ICD-10-CM

## 2018-03-01 DIAGNOSIS — R262 Difficulty in walking, not elsewhere classified: Secondary | ICD-10-CM

## 2018-03-01 DIAGNOSIS — M25652 Stiffness of left hip, not elsewhere classified: Secondary | ICD-10-CM

## 2018-03-01 DIAGNOSIS — M25552 Pain in left hip: Secondary | ICD-10-CM

## 2018-03-01 NOTE — Therapy (Signed)
[facility] [address]  [address] [place], [place], [address] Phone: [phone]   Fax:  [phone]  Physical Therapy Treatment  Patient Details  Name: Renee Hood MRN: [id number] Date of Birth: [birth year] Referring Provider (PT): Margarita Ranaimothy Murphy, MD   Encounter Date: 03/01/2018  PT End of Session - 03/01/18 1149    Visit Number  2    Number of Visits  13    Date for PT Re-Evaluation  04/08/18    Authorization Type  [employer]    PT Start Time  1102    PT Stop Time  1146    PT Time Calculation (min)  44 min    Activity Tolerance  Patient tolerated treatment well;Patient limited by pain    Behavior During Therapy  [facility] for tasks assessed/performed       Past Medical History:  Diagnosis Date  . Anxiety   . Depression   . Diverticulitis   . Family history of adverse reaction to anesthesia    daughter  . No pertinent past medical history     Past Surgical History:  Procedure Laterality Date  . BREAST ENHANCEMENT SURGERY  2006  . CESAREAN SECTION     X2  . DILATION AND EVACUATION  12/28/2010   Procedure: DILATATION AND EVACUATION (D&E);  Surgeon: Tereso NewcomerUgonna A Anyanwu, MD;  Location: [facility];  Service: Gynecology;  Laterality: N/A;  . dislocated hip  21 months   surgery to repair/tn  . EPIGASTRIC HERNIA REPAIR N/A 05/08/2016   Procedure: HERNIA REPAIR EPIGASTRIC ADULT;  Surgeon: Berna Buehelsea A Connor, MD;  Location: [facility];  Service: General;  Laterality: N/A;    There were no vitals filed for this visit.  Subjective Assessment - 03/01/18 1103    Subjective  Patient reports she has only been able to perform them once a day but has done a lot of walking. Has had mild pain, with sidelying abduction being most painful. Started walking with a SPC yesterday.    Diagnostic tests  01/11/18 L hip CT: moderate OA of L hip, No acute osseous injury of the left hip. 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  No/denies                       [facility] Treatment/Exercise - 03/01/18 0001      Ambulation/Gait   Ambulation Distance (Feet)  180 Feet    Assistive device  Straight cane    Gait Pattern  Step-through pattern;Decreased hip/knee flexion - left;Decreased weight shift to left;Poor foot clearance - left    Ambulation Surface  Level;Indoor    Gait Comments  cues for increased L weight shifts and increased step length on R foot as tolerate with SPC and CGA      Exercises   Exercises  Knee/Hip      Knee/Hip Exercises: Aerobic   Nustep  L1 x 6min UEs/LEs      Knee/Hip Exercises: Seated   Long Arc Quad  Strengthening;Left;1 set;10 reps;Weights    Long Arc Quad Weight  3 lbs.    Long Arc Quad Limitations  10x     Hamstring Curl  Strengthening;Left;15 reps;Limitations;2 sets    Hamstring Limitations  green TB, blue TB    Sit to Sand  1  set;10 reps;without UE support   cues for L weight shift and proper set up     Knee/Hip Exercises: Supine   Heel Slides  AAROM;Left;1 set;10 reps   good ROM   Heel Slides Limitations  10x3" with orange ball and strap    Bridges  Strengthening;Both;1 set;10 reps    Bridges Limitations  just to below neutral hip to avoid pain    Bridges with [employer]  Strengthening;Both;1 set;10 reps      Knee/Hip Exercises: Sidelying   Hip ABduction  Strengthening;Left;2 sets;5 sets;Limitations    Hip ABduction Limitations  manual assistance for neutral hip alignment             PT Education - 03/01/18 1148    Education Details  update to HEP; administered blue TB    Person(s) Educated  Patient;Spouse    Methods  Explanation;Demonstration;Tactile cues;Verbal cues;Handout    Comprehension  Verbalized understanding;Returned demonstration       PT Short Term Goals - 03/01/18 1153      PT SHORT TERM GOAL #1   Title   Patient to be independent with initial HEP.    Time  3    Period  Weeks    Status  On-going        PT Long Term Goals - 03/01/18 1153      PT LONG TERM GOAL #1   Title  Patient to be independent with advanced HEP.    Time  6    Period  Weeks    Status  On-going      PT LONG TERM GOAL #2   Title  Patient to demonstrate >=4+/5 strength in B LEs.    Time  6    Period  Weeks    Status  On-going      PT LONG TERM GOAL #3   Title  Patient to demonstrate L hip AROM symmetrical to oppisite LE and non-painful.    Time  6    Period  Weeks    Status  On-going      PT LONG TERM GOAL #4   Title  Patient to demonstrate symmetrical step length, weight shift, and hio flexion with ambulation with LRAD.    Time  6    Period  Weeks    Status  On-going      PT LONG TERM GOAL #5   Title  Patient to return to 8,000 steps/day without pain limiting.     Time  6    Period  Weeks    Status  On-going            Plan - 03/01/18 1149    Clinical Impression Statement  Patient arrived to session with report that she transitioned to a [facility] yesterday. Has been compliant with HEP 1x/day because she has been doing a lot of walking and has been experiencing muscle fatigue as a result. Worked on gait training with SPC with cues to increase L weight shift and R step length as tolerated, while still complying with post-surgical precautions. Reviewed HEP- corrected alignment of sidelying abduction to promote neutral positioning. Progressed hamstring curls with blue band. Introduced sit to stands with cues to shift to L with good carryover. Patient reported understanding of new HEP updates. Offered ice at end of session but patient declined, noting she would ice at home.     Clinical Impairments Affecting Rehab Potential  diverticulitis, dislocated hip    PT Treatment/Interventions  ADLs/Self Care Home Management;Cryotherapy;Electrical Stimulation;Functional mobility  training;Stair training;Gait training;DME  Instruction;Ultrasound;Moist Heat;Therapeutic activities;Therapeutic exercise;Balance training;Neuromuscular re-education;Patient/family education;Passive range of motion;Scar mobilization;Manual techniques;Dry needling;Energy conservation;Splinting;Taping;Vasopneumatic Device    PT Next Visit Plan  progress LE strengthening as tolerated    Consulted and Agree with Plan of Care  Patient       Patient will benefit from skilled therapeutic intervention in order to improve the following deficits and impairments:  Decreased range of motion, Difficulty walking, Decreased activity tolerance, Pain, Decreased balance, Impaired flexibility, Decreased scar mobility, Postural dysfunction, Decreased strength, Increased edema, Improper body mechanics  Visit Diagnosis: Pain in left hip  Stiffness of left hip, not elsewhere classified  Muscle weakness (generalized)  Difficulty in walking, not elsewhere classified     Problem List Patient Active Problem List   Diagnosis Date Noted  . Incarcerated epigastric hernia 07/17/2012  . Abortion, missed 02/14/2011    Anette GuarneriYevgeniya Kovalenko, PT, DPT 03/01/18 11:54 AM   [facility] [address]  [address] [place], [place], [address] Phone: [phone]   Fax:  [phone]  Name: Renee Hood MRN: [id number] Date of Birth: 1975-11-04

## 2018-03-05 ENCOUNTER — Ambulatory Visit: Payer: Managed Care, Other (non HMO) | Admitting: Physical Therapy

## 2018-03-05 ENCOUNTER — Encounter: Payer: Self-pay | Admitting: Physical Therapy

## 2018-03-05 DIAGNOSIS — R262 Difficulty in walking, not elsewhere classified: Secondary | ICD-10-CM

## 2018-03-05 DIAGNOSIS — M6281 Muscle weakness (generalized): Secondary | ICD-10-CM

## 2018-03-05 DIAGNOSIS — M25652 Stiffness of left hip, not elsewhere classified: Secondary | ICD-10-CM

## 2018-03-05 DIAGNOSIS — M25552 Pain in left hip: Secondary | ICD-10-CM | POA: Diagnosis not present

## 2018-03-05 NOTE — Therapy (Signed)
[facility] [address]  [address] [place], [place], [address] Phone: [phone]   Fax:  [phone]  Physical Therapy Treatment  Patient Details  Name: Renee Hood MRN: [id number] Date of Birth: March 31, 1975 Referring Provider (PT): Margarita Rana, MD   Encounter Date: 03/05/2018  PT End of Session - 03/05/18 1223    Visit Number  3    Number of Visits  13    Date for PT Re-Evaluation  04/08/18    Authorization Type  [employer]    PT Start Time  0925    PT Stop Time  1012    PT Time Calculation (min)  47 min    Activity Tolerance  Patient tolerated treatment well;Patient limited by pain    Behavior During Therapy  [facility] for tasks assessed/performed       Past Medical History:  Diagnosis Date  . Anxiety   . Depression   . Diverticulitis   . Family history of adverse reaction to anesthesia    daughter  . No pertinent past medical history     Past Surgical History:  Procedure Laterality Date  . BREAST ENHANCEMENT SURGERY  2006  . CESAREAN SECTION     X2  . DILATION AND EVACUATION  12/28/2010   Procedure: DILATATION AND EVACUATION (D&E);  Surgeon: Tereso Newcomer, MD;  Location: [facility];  Service: Gynecology;  Laterality: N/A;  . dislocated hip  21 months   surgery to repair/tn  . EPIGASTRIC HERNIA REPAIR N/A 05/08/2016   Procedure: HERNIA REPAIR EPIGASTRIC ADULT;  Surgeon: Berna Bue, MD;  Location: [facility];  Service: General;  Laterality: N/A;    There were no vitals filed for this visit.  Subjective Assessment - 03/05/18 0926    Subjective  Reports she was doing a lot of standing in the kitchen yesterday and noticed a "crampy tightness in the joint." Still having it some today.     Patient is accompained by:  Family member   husband   Pertinent History  diverticulitis, dislocated hip    Diagnostic tests  01/11/18 L hip CT: moderate OA of L hip, No acute osseous injury of the left hip. Coxa magna as can be  seen with prior Legg-Calve-Perthes versus slipped capital femoral epiphysis    Patient Stated Goals  get rid of walker and get back to work    Currently in Pain?  Yes    Pain Score  4     Pain Location  Hip    Pain Orientation  Left    Pain Descriptors / Indicators  Cramping;Tightness    Pain Type  Acute pain;Surgical pain         [facility] PT Assessment - 03/05/18 0001      AROM   AROM Assessment Site  Hip    Right/Left Hip  Right    Right Hip Flexion  132    Right Hip External Rotation   44    Right Hip Internal Rotation   38    Right Hip ABduction  39                   [facility] Adult PT Treatment/Exercise - 03/05/18 0001      Ambulation/Gait   Ambulation Distance (Feet)  180 Feet    Assistive device  Straight cane;None    Gait Pattern  Step-through pattern;Decreased hip/knee flexion - left;Decreased weight shift to left;Poor foot clearance - left    Ambulation Surface  Level;Indoor    Gait Comments  cues for L weight shift      Knee/Hip Exercises: Stretches   Passive Hamstring Stretch  Left;30 seconds;2 reps   requiring assistance to lift L LE up   Passive Hamstring Stretch Limitations  supine strap to tolerance    Hip Flexor Stretch  Left;2 reps;30 seconds    Hip Flexor Stretch Limitations  mod thomas stretch with assistance to avoid going past neutral hip      Knee/Hip Exercises: Aerobic   Recumbent Bike  L1 x 6 min      Knee/Hip Exercises: Standing   Heel Raises  Both;1 set;15 reps    Heel Raises Limitations  at counter top    Functional Squat  1 set;10 reps    Functional Squat Limitations  at counter top; cues to bring bottom back    Other Standing Knee Exercises  L weight shift + step on 4" step with SPC/HHA x10   cues for increased control     Knee/Hip Exercises: Seated   Other Seated Knee/Hip Exercises  B hip adduction ball squeeze 5x10"    Abduction/Adduction   Strengthening;Both;1 set;10 reps;Limitations    Abd/Adduction Limitations  B hip ER to  neutral with red TB around knees 10x3"    Sit to Sand  1 set;10 reps;without UE support   red TB around waist to encourage L wt shift     Knee/Hip Exercises: Supine   Heel Slides  AAROM;Left;1 set;10 reps    Heel Slides Limitations  10x3" with orange ball and strap    Bridges with Beacher MayBall Squeeze  Strengthening;Both;1 set;15 reps             PT Education - 03/05/18 1221    Education Details  update to HEP; administered red    Person(s) Educated  Patient    Methods  Explanation;Demonstration;Tactile cues;Verbal cues;Handout    Comprehension  Verbalized understanding;Returned demonstration       PT Short Term Goals - 03/01/18 1153      PT SHORT TERM GOAL #1   Title  Patient to be independent with initial HEP.    Time  3    Period  Weeks    Status  On-going        PT Long Term Goals - 03/01/18 1153      PT LONG TERM GOAL #1   Title  Patient to be independent with advanced HEP.    Time  6    Period  Weeks    Status  On-going      PT LONG TERM GOAL #2   Title  Patient to demonstrate >=4+/5 strength in B LEs.    Time  6    Period  Weeks    Status  On-going      PT LONG TERM GOAL #3   Title  Patient to demonstrate L hip AROM symmetrical to oppisite LE and non-painful.    Time  6    Period  Weeks    Status  On-going      PT LONG TERM GOAL #4   Title  Patient to demonstrate symmetrical step length, weight shift, and hio flexion with ambulation with LRAD.    Time  6    Period  Weeks    Status  On-going      PT LONG TERM GOAL #5   Title  Patient to return to 8,000 steps/day without pain limiting.     Time  6    Period  Weeks    Status  On-going            Plan - 03/05/18 1223    Clinical Impression Statement  Patient reporting that she has been more and more active at home lately- tolerated prolonged standing at kitchen to bake cookies yesterday which caused a "crampy tightness" in L hip. Introduced HS stretch- patient requiring assistance to lift L LE  into hip flexion, however able to maintain it during the stretch. Patient reporting pain with sidelying hip abduction at home- advised patient to discontinue this exercise for the time being. Better tolerance of sitting hip ER to neutral with red banded resistance. Worked on pre-gait and gait training with and without SPC with emphasis on L weight shift before stepping with R LE. Patient demonstrating mild instability on feet when ambulating without SPC. Better performance of STS today and more equal weight shift. Ended session with no complaints.     Clinical Impairments Affecting Rehab Potential  diverticulitis, dislocated hip    PT Treatment/Interventions  ADLs/Self Care Home Management;Cryotherapy;Electrical Stimulation;Functional mobility training;Stair training;Gait training;DME Instruction;Ultrasound;Moist Heat;Therapeutic activities;Therapeutic exercise;Balance training;Neuromuscular re-education;Patient/family education;Passive range of motion;Scar mobilization;Manual techniques;Dry needling;Energy conservation;Splinting;Taping;Vasopneumatic Device    PT Next Visit Plan  progress LE strengthening as tolerated    Consulted and Agree with Plan of Care  Patient       Patient will benefit from skilled therapeutic intervention in order to improve the following deficits and impairments:  Decreased range of motion, Difficulty walking, Decreased activity tolerance, Pain, Decreased balance, Impaired flexibility, Decreased scar mobility, Postural dysfunction, Decreased strength, Increased edema, Improper body mechanics  Visit Diagnosis: Pain in left hip  Stiffness of left hip, not elsewhere classified  Muscle weakness (generalized)  Difficulty in walking, not elsewhere classified     Problem List Patient Active Problem List   Diagnosis Date Noted  . Incarcerated epigastric hernia 07/17/2012  . Abortion, missed 02/14/2011    Anette GuarneriYevgeniya Kenadee Gates, PT, DPT 03/05/18 12:27 PM   [facility] [address]  [address] [place], [place], [address] Phone: [phone]   Fax:  [phone]  Name: Renee Hood MRN: [id number] Date of Birth: Aug 05, 1975

## 2018-03-19 ENCOUNTER — Encounter: Payer: Self-pay | Admitting: Physical Therapy

## 2018-03-19 ENCOUNTER — Ambulatory Visit: Payer: Managed Care, Other (non HMO) | Attending: Orthopedic Surgery | Admitting: Physical Therapy

## 2018-03-19 DIAGNOSIS — M25652 Stiffness of left hip, not elsewhere classified: Secondary | ICD-10-CM | POA: Diagnosis present

## 2018-03-19 DIAGNOSIS — M6281 Muscle weakness (generalized): Secondary | ICD-10-CM | POA: Insufficient documentation

## 2018-03-19 DIAGNOSIS — R262 Difficulty in walking, not elsewhere classified: Secondary | ICD-10-CM | POA: Insufficient documentation

## 2018-03-19 DIAGNOSIS — M25552 Pain in left hip: Secondary | ICD-10-CM

## 2018-03-19 NOTE — Therapy (Signed)
[facility] [address]  [address] [place], [place], [address] Phone: [phone]   Fax:  [phone]  Physical Therapy Treatment  Patient Details  Name: Renee Hood MRN: [id number] Date of Birth: 1975/09/21 Referring Provider (PT): Margarita Rana, MD   Encounter Date: 03/19/2018  PT End of Session - 03/19/18 1144    Visit Number  4    Number of Visits  13    Date for PT Re-Evaluation  04/08/18    Authorization Type  [employer]    PT Start Time  1103    PT Stop Time  1143    PT Time Calculation (min)  40 min    Activity Tolerance  Patient tolerated treatment well    Behavior During Therapy  [facility] for tasks assessed/performed       Past Medical History:  Diagnosis Date  . Anxiety   . Depression   . Diverticulitis   . Family history of adverse reaction to anesthesia    daughter  . No pertinent past medical history     Past Surgical History:  Procedure Laterality Date  . BREAST ENHANCEMENT SURGERY  2006  . CESAREAN SECTION     X2  . DILATION AND EVACUATION  12/28/2010   Procedure: DILATATION AND EVACUATION (D&E);  Surgeon: Tereso Newcomer, MD;  Location: [facility];  Service: Gynecology;  Laterality: N/A;  . dislocated hip  21 months   surgery to repair/tn  . EPIGASTRIC HERNIA REPAIR N/A 05/08/2016   Procedure: HERNIA REPAIR EPIGASTRIC ADULT;  Surgeon: Berna Bue, MD;  Location: [facility];  Service: General;  Laterality: N/A;    There were no vitals filed for this visit.  Subjective Assessment - 03/19/18 1104    Subjective  Reports that she got out of bed and did not feel shaky- today is a good day. Did a lot of walking over her holiday and had some swelling but it went down. Stopped using SPC. Saw MD on 12/27 who took off bandage and did xrays.  Scheduled to go back to work 01/27.    Pertinent History  diverticulitis, dislocated hip    Diagnostic tests  01/11/18 L hip CT: moderate OA of L hip, No acute osseous injury  of the left hip. 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  No/denies                       [facility] Adult PT Treatment/Exercise - 03/19/18 0001      Knee/Hip Exercises: Stretches   Hip Flexor Stretch  Left;2 reps;30 seconds    Hip Flexor Stretch Limitations  mod thomas stretch with assistance to avoid going past neutral hip    Gastroc Stretch  Left;2 reps;30 seconds;Limitations    Gastroc Stretch Limitations  prostretch at counter      Knee/Hip Exercises: Aerobic   Recumbent Bike  L1 x 6 min      Knee/Hip Exercises: Standing   Hip Flexion  Stengthening;Right;Left;10 reps;Knee bent;2 sets    Hip Flexion Limitations  resisted marching with yellow TB around toes    Lateral Step Up  Left;1 set;10 reps;Hand Hold: 1;Step Height: 8"    Lateral Step Up Limitations  reporting L hip flexor fatigue    Forward Step Up  Left;1 set;10 reps;Hand  Hold: 0;Step Height: 8"    Forward Step Up Limitations  cues for slow eccentric lower    Functional Squat  1 set;10 reps    Functional Squat Limitations  at counter top with red TB    SLS  L SLS on foam 4x20"    cues for glute contraction to avoid anterior trunk lean            PT Education - 03/19/18 1144    Education Details  update to HEP; administered yellow TB    Person(s) Educated  Patient    Methods  Explanation;Demonstration;Tactile cues;Verbal cues;Handout    Comprehension  Verbalized understanding;Returned demonstration       PT Short Term Goals - 03/01/18 1153      PT SHORT TERM GOAL #1   Title  Patient to be independent with initial HEP.    Time  3    Period  Weeks    Status  On-going        PT Long Term Goals - 03/01/18 1153      PT LONG TERM GOAL #1   Title  Patient to be independent with advanced HEP.    Time  6    Period  Weeks    Status  On-going      PT LONG  TERM GOAL #2   Title  Patient to demonstrate >=4+/5 strength in B LEs.    Time  6    Period  Weeks    Status  On-going      PT LONG TERM GOAL #3   Title  Patient to demonstrate L hip AROM symmetrical to oppisite LE and non-painful.    Time  6    Period  Weeks    Status  On-going      PT LONG TERM GOAL #4   Title  Patient to demonstrate symmetrical step length, weight shift, and hio flexion with ambulation with LRAD.    Time  6    Period  Weeks    Status  On-going      PT LONG TERM GOAL #5   Title  Patient to return to 8,000 steps/day without pain limiting.     Time  6    Period  Weeks    Status  On-going            Plan - 03/19/18 1145    Clinical Impression Statement  Patient arrived to session with report of increased activity since going on holiday to see her sister. Reports that she has weaned herself off SPC. Patient reporting still having difficulty with putting on shoes- advised patient to continue performing heel slides to improve hip flexion ROM. Also addressed hip flexion weakness with standing marching with banded resistance. Patient reporting muscle fatigue but no pain. Able to perform anterior and lateral step ups with good stability today. Some difficulty with SLS on L LE d/t L hip weakness and hip flexor tightness causing anterior trunk lean. Update HEP to include exercises that were well tolerated today. Ended session with report of L hip feeling "worked out" but no pain.      PT Treatment/Interventions  ADLs/Self Care Home Management;Cryotherapy;Electrical Stimulation;Functional mobility training;Stair training;Gait training;DME Instruction;Ultrasound;Moist Heat;Therapeutic activities;Therapeutic exercise;Balance training;Neuromuscular re-education;Patient/family education;Passive range of motion;Scar mobilization;Manual techniques;Dry needling;Energy conservation;Splinting;Taping;Vasopneumatic Device    PT Next Visit Plan  progress LE strengthening as tolerated     Consulted and Agree with Plan of Care  Patient       Patient will benefit from skilled  therapeutic intervention in order to improve the following deficits and impairments:  Decreased range of motion, Difficulty walking, Decreased activity tolerance, Pain, Decreased balance, Impaired flexibility, Decreased scar mobility, Postural dysfunction, Decreased strength, Increased edema, Improper body mechanics  Visit Diagnosis: Pain in left hip  Stiffness of left hip, not elsewhere classified  Muscle weakness (generalized)  Difficulty in walking, not elsewhere classified     Problem List Patient Active Problem List   Diagnosis Date Noted  . Incarcerated epigastric hernia 07/17/2012  . Abortion, missed 02/14/2011     Anette GuarneriYevgeniya Nylee Barbuto, PT, DPT 03/19/18 11:49 AM   [facility] [address]  [address] [place], [place], [address] Phone: [phone]   Fax:  [phone]  Name: Kandis BanLori C Lehigh MRN: [id number] Date of Birth: 1976-02-09

## 2018-03-22 ENCOUNTER — Ambulatory Visit: Payer: Managed Care, Other (non HMO) | Admitting: Physical Therapy

## 2018-03-22 ENCOUNTER — Encounter: Payer: Self-pay | Admitting: Physical Therapy

## 2018-03-22 DIAGNOSIS — M25652 Stiffness of left hip, not elsewhere classified: Secondary | ICD-10-CM

## 2018-03-22 DIAGNOSIS — M25552 Pain in left hip: Secondary | ICD-10-CM | POA: Diagnosis not present

## 2018-03-22 DIAGNOSIS — R262 Difficulty in walking, not elsewhere classified: Secondary | ICD-10-CM

## 2018-03-22 DIAGNOSIS — M6281 Muscle weakness (generalized): Secondary | ICD-10-CM

## 2018-03-22 NOTE — Therapy (Signed)
[facility] [address]  [address] [place], [place], [address] Phone: [phone]   Fax:  [phone]  Physical Therapy Treatment  Patient Details  Name: MANJIT BUFANO MRN: [id number] Date of Birth: [birth year] Referring Provider (PT): Margarita Rana, MD   Encounter Date: 03/22/2018  PT End of Session - 03/22/18 0929    Visit Number  5    Number of Visits  13    Date for PT Re-Evaluation  04/08/18    Authorization Type  [employer]    PT Start Time  0848    PT Stop Time  0928    PT Time Calculation (min)  40 min    Activity Tolerance  Patient tolerated treatment well    Behavior During Therapy  [facility] for tasks assessed/performed       Past Medical History:  Diagnosis Date  . Anxiety   . Depression   . Diverticulitis   . Family history of adverse reaction to anesthesia    daughter  . No pertinent past medical history     Past Surgical History:  Procedure Laterality Date  . BREAST ENHANCEMENT SURGERY  2006  . CESAREAN SECTION     X2  . DILATION AND EVACUATION  12/28/2010   Procedure: DILATATION AND EVACUATION (D&E);  Surgeon: Tereso Newcomer, MD;  Location: [facility];  Service: Gynecology;  Laterality: N/A;  . dislocated hip  21 months   surgery to repair/tn  . EPIGASTRIC HERNIA REPAIR N/A 05/08/2016   Procedure: HERNIA REPAIR EPIGASTRIC ADULT;  Surgeon: Berna Bue, MD;  Location: [facility];  Service: General;  Laterality: N/A;    There were no vitals filed for this visit.  Subjective Assessment - 03/22/18 0847    Subjective  Reports that she can now tie her shoes. Believes that the stretches helped.     Pertinent History  diverticulitis, dislocated hip    Diagnostic tests  01/11/18 L hip CT: moderate OA of L hip, No acute osseous injury of the left hip. 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  No/denies         [facility] PT Assessment - 03/22/18 0001      AROM   Left Hip Flexion  100                   [facility] Adult PT Treatment/Exercise - 03/22/18 0001      Ambulation/Gait   Stairs  Yes    Stairs Assistance  4: Min guard    Stair Management Technique  One rail Right;No rails    Number of Stairs  13    Height of Stairs  8    Gait Comments  2x ascending/descending stairs- mild quad instability with descending, slow and discontinuout stepping when ascending      Knee/Hip Exercises: Stretches   Hip Flexor Stretch  Left;2 reps;30 seconds    Hip Flexor Stretch Limitations  mod thomas stretch with assistance to avoid going past neutral hip    Gastroc Stretch  Left;2 reps;30 seconds;Limitations    Gastroc Stretch Limitations  prostretch at counter      Knee/Hip Exercises: Aerobic   Nustep  L2 x LEs only      Knee/Hip Exercises: Standing   Knee Flexion  Strengthening;Right;Left;1 set;10 reps;15 reps;Limitations  Knee Flexion Limitations  3#    Hip Flexion  Stengthening;Right;Left;10 reps;Knee bent;2 sets    Hip Flexion Limitations  resisted marching with yellow TB around toes   difficulty with red TB; heavy cues to avoid ant trunk lean   Functional Squat  2 sets;10 reps    Functional Squat Limitations  at counter top with red TB    SLS  L SLS on foam + ball toss x6 min   cues to correct anterior trunk lean; LOB to L   Other Standing Knee Exercises  sidestepping with yellow TB around toes 2x length on counter top    cues to avoid lateral trunk lean     Knee/Hip Exercises: Sidelying   Hip ABduction  Strengthening;Left;Limitations;1 set;10 reps    Hip ABduction Limitations  much improved form             PT Education - 03/22/18 0929    Education Details  update to HEP    Person(s) Educated  Patient    Methods  Explanation;Demonstration;Tactile cues;Verbal cues;Handout    Comprehension  Verbalized understanding;Returned  demonstration       PT Short Term Goals - 03/22/18 [id number]      PT SHORT TERM GOAL #1   Title  Patient to be independent with initial HEP.    Time  3    Period  Weeks    Status  Achieved        PT Long Term Goals - 03/01/18 1153      PT LONG TERM GOAL #1   Title  Patient to be independent with advanced HEP.    Time  6    Period  Weeks    Status  On-going      PT LONG TERM GOAL #2   Title  Patient to demonstrate >=4+/5 strength in B LEs.    Time  6    Period  Weeks    Status  On-going      PT LONG TERM GOAL #3   Title  Patient to demonstrate L hip AROM symmetrical to oppisite LE and non-painful.    Time  6    Period  Weeks    Status  On-going      PT LONG TERM GOAL #4   Title  Patient to demonstrate symmetrical step length, weight shift, and hio flexion with ambulation with LRAD.    Time  6    Period  Weeks    Status  On-going      PT LONG TERM GOAL #5   Title  Patient to return to 8,000 steps/day without pain limiting.     Time  6    Period  Weeks    Status  On-going            Plan - 03/22/18 [id number]    Clinical Impression Statement  Patient arrived to session with report that she can now tie her shoes d/t new HEP given last session. Worked on ascending/descending stairs with and without handrail- mild quad instability with descending, slow and discontinuous stepping when ascending which was improved with handrail use. Attempted increased resistance with resisted marching- patient demonstrating considerable anterior trunk lean which improved with decreasing resistance. Patient reported muscle burn which dissipated with modified Thomas stretch. Much improved form demonstrated with sidelying hip abduction- advised patient to add this exercise back into HEP. Worked on standing balance on L LE with patient demonstrating anterior trunk lean and LOB to the L. Heavy reliance on ankle  stability exhibited d/t hip weakness. Patient showing consistent improvement with PT thus  far.     Clinical Impairments Affecting Rehab Potential  diverticulitis, dislocated hip    PT Treatment/Interventions  ADLs/Self Care Home Management;Cryotherapy;Electrical Stimulation;Functional mobility training;Stair training;Gait training;DME Instruction;Ultrasound;Moist Heat;Therapeutic activities;Therapeutic exercise;Balance training;Neuromuscular re-education;Patient/family education;Passive range of motion;Scar mobilization;Manual techniques;Dry needling;Energy conservation;Splinting;Taping;Vasopneumatic Device    PT Next Visit Plan  progress LE strengthening as tolerated    Consulted and Agree with Plan of Care  Patient       Patient will benefit from skilled therapeutic intervention in order to improve the following deficits and impairments:  Decreased range of motion, Difficulty walking, Decreased activity tolerance, Pain, Decreased balance, Impaired flexibility, Decreased scar mobility, Postural dysfunction, Decreased strength, Increased edema, Improper body mechanics  Visit Diagnosis: Pain in left hip  Stiffness of left hip, not elsewhere classified  Muscle weakness (generalized)  Difficulty in walking, not elsewhere classified     Problem List Patient Active Problem List   Diagnosis Date Noted  . Incarcerated epigastric hernia 07/17/2012  . Abortion, missed 02/14/2011     Anette GuarneriYevgeniya Melonee Gerstel, PT, DPT 03/22/18 10:22 AM   [facility] [address]  [address] [place], [place], [address] Phone: [phone]   Fax:  [phone]  Name: Kandis BanLori C Yera MRN: [id number] Date of Birth: March 07, 1976

## 2018-03-25 ENCOUNTER — Encounter: Payer: Self-pay | Admitting: Physical Therapy

## 2018-03-25 ENCOUNTER — Ambulatory Visit: Payer: Managed Care, Other (non HMO) | Admitting: Physical Therapy

## 2018-03-25 DIAGNOSIS — M25552 Pain in left hip: Secondary | ICD-10-CM | POA: Diagnosis not present

## 2018-03-25 DIAGNOSIS — M25652 Stiffness of left hip, not elsewhere classified: Secondary | ICD-10-CM

## 2018-03-25 DIAGNOSIS — M6281 Muscle weakness (generalized): Secondary | ICD-10-CM

## 2018-03-25 DIAGNOSIS — R262 Difficulty in walking, not elsewhere classified: Secondary | ICD-10-CM

## 2018-03-25 NOTE — Therapy (Signed)
[facility] [address]  [address] [place], [place], [address] Phone: [phone]   Fax:  [phone]  Physical Therapy Treatment  Patient Details  Name: Renee Hood MRN: [id number] Date of Birth: 1975-05-17 Referring Provider (PT): Margarita Rana, MD   Encounter Date: 03/25/2018  PT End of Session - 03/25/18 1146    Visit Number  6    Number of Visits  13    Date for PT Re-Evaluation  04/08/18    Authorization Type  [employer]    PT Start Time  1058    PT Stop Time  1144    PT Time Calculation (min)  46 min    Activity Tolerance  Patient tolerated treatment well    Behavior During Therapy  [facility] for tasks assessed/performed       Past Medical History:  Diagnosis Date  . Anxiety   . Depression   . Diverticulitis   . Family history of adverse reaction to anesthesia    daughter  . No pertinent past medical history     Past Surgical History:  Procedure Laterality Date  . BREAST ENHANCEMENT SURGERY  2006  . CESAREAN SECTION     X2  . DILATION AND EVACUATION  12/28/2010   Procedure: DILATATION AND EVACUATION (D&E);  Surgeon: Tereso Newcomer, MD;  Location: [facility];  Service: Gynecology;  Laterality: N/A;  . dislocated hip  21 months   surgery to repair/tn  . EPIGASTRIC HERNIA REPAIR N/A 05/08/2016   Procedure: HERNIA REPAIR EPIGASTRIC ADULT;  Surgeon: Berna Bue, MD;  Location: [facility];  Service: General;  Laterality: N/A;    There were no vitals filed for this visit.  Subjective Assessment - 03/25/18 1056    Subjective  Patient reporting that she is feeling pretty good today; had a bit of soreness after last session which dissipated.     Pertinent History  diverticulitis, dislocated hip    Diagnostic tests  01/11/18 L hip CT: moderate OA of L hip, No acute osseous injury of the left hip. 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  No/denies                       [facility] Adult PT Treatment/Exercise - 03/25/18 0001      Knee/Hip Exercises: Stretches   Gastroc Stretch  Left;1 rep;30 seconds    Gastroc Stretch Limitations  plantar stretch on wall    Other Knee/Hip Stretches  L LE KTOS 2x15"       Knee/Hip Exercises: Aerobic   Nustep  L2 x LEs only      Knee/Hip Exercises: Standing   Side Lunges  Right;Left;Both;1 set;10 reps   cues for smaller step on R to avoid excess L hip abduction   Side Lunges Limitations  TRX; mini side lunge to tolerance    Lateral Step Up  Left;1 set;10 reps;Hand Hold: 1;Step Height: 8"    Lateral Step Up Limitations  step up + down with cues to avoid lateral trunk lean    Forward Step Up  Left;1 set;10 reps;Hand Hold: 0;Step Height: 8"   mild instability    Forward Step Up Limitations  step up + down with cues for slow eccentric lower    Functional Squat  2 sets;10  reps    Functional Squat Limitations  TRX; 2nd set with green TB    SLS  L SLS on foam + yellow medball ball toss hand to hand 2x20    Other Standing Knee Exercises  L hip hike on 4" step x10    heavy manual cues to avoid overactivation of R QL     Knee/Hip Exercises: Prone   Other Prone Exercises  cat/cow 10x              PT Education - 03/25/18 1146    Education Details  update to HEP    Person(s) Educated  Patient    Methods  Explanation;Demonstration;Tactile cues;Verbal cues;Handout    Comprehension  Verbalized understanding;Returned demonstration       PT Short Term Goals - 03/22/18 [id number]      PT SHORT TERM GOAL #1   Title  Patient to be independent with initial HEP.    Time  3    Period  Weeks    Status  Achieved        PT Long Term Goals - 03/01/18 1153      PT LONG TERM GOAL #1   Title  Patient to be independent with advanced HEP.    Time  6    Period  Weeks    Status  On-going      PT LONG TERM GOAL #2   Title   Patient to demonstrate >=4+/5 strength in B LEs.    Time  6    Period  Weeks    Status  On-going      PT LONG TERM GOAL #3   Title  Patient to demonstrate L hip AROM symmetrical to oppisite LE and non-painful.    Time  6    Period  Weeks    Status  On-going      PT LONG TERM GOAL #4   Title  Patient to demonstrate symmetrical step length, weight shift, and hio flexion with ambulation with LRAD.    Time  6    Period  Weeks    Status  On-going      PT LONG TERM GOAL #5   Title  Patient to return to 8,000 steps/day without pain limiting.     Time  6    Period  Weeks    Status  On-going            Plan - 03/25/18 1147    Clinical Impression Statement  Patient arrived to session with report of no new complaints. Worked on anterior and lateral step ups with patient showing mild instability throughout activity. Introduced hip hiking with heavy manual cues to avoid overactivation of R QL as compensation. Worked on squats and mini side lunges using TRX support- patient requiring slight cueing for L weight shift and maintaining knees in line with toes. Provided cues for lateral hip activation with SLS on foam today with slightly improved form and decreased ankle instability. Updated HEP to include cat/cow for relief of patient's intermittent lumbar tightness. No complaints at end of session.    Clinical Impairments Affecting Rehab Potential  diverticulitis, dislocated hip    PT Treatment/Interventions  ADLs/Self Care Home Management;Cryotherapy;Electrical Stimulation;Functional mobility training;Stair training;Gait training;DME Instruction;Ultrasound;Moist Heat;Therapeutic activities;Therapeutic exercise;Balance training;Neuromuscular re-education;Patient/family education;Passive range of motion;Scar mobilization;Manual techniques;Dry needling;Energy conservation;Splinting;Taping;Vasopneumatic Device    PT Next Visit Plan  progress LE strengthening as tolerated    Consulted and Agree with  Plan of Care  Patient  Patient will benefit from skilled therapeutic intervention in order to improve the following deficits and impairments:  Decreased range of motion, Difficulty walking, Decreased activity tolerance, Pain, Decreased balance, Impaired flexibility, Decreased scar mobility, Postural dysfunction, Decreased strength, Increased edema, Improper body mechanics  Visit Diagnosis: Pain in left hip  Stiffness of left hip, not elsewhere classified  Muscle weakness (generalized)  Difficulty in walking, not elsewhere classified     Problem List Patient Active Problem List   Diagnosis Date Noted  . Incarcerated epigastric hernia 07/17/2012  . Abortion, missed 02/14/2011    Anette Guarneri, PT, DPT 03/25/18 11:48 AM    [facility] [address]  [address] [place], [place], [address] Phone: [phone]   Fax:  [phone]  Name: Renee Hood MRN: [id number] Date of Birth: 1975-07-20

## 2018-03-28 ENCOUNTER — Ambulatory Visit: Payer: Managed Care, Other (non HMO) | Admitting: Physical Therapy

## 2018-03-28 ENCOUNTER — Encounter: Payer: Self-pay | Admitting: Physical Therapy

## 2018-03-28 DIAGNOSIS — R262 Difficulty in walking, not elsewhere classified: Secondary | ICD-10-CM

## 2018-03-28 DIAGNOSIS — M25652 Stiffness of left hip, not elsewhere classified: Secondary | ICD-10-CM

## 2018-03-28 DIAGNOSIS — M25552 Pain in left hip: Secondary | ICD-10-CM

## 2018-03-28 DIAGNOSIS — M6281 Muscle weakness (generalized): Secondary | ICD-10-CM

## 2018-03-28 NOTE — Therapy (Signed)
[facility] [address]  [address] [place], [place], [address] Phone: [phone]   Fax:  [phone]  Physical Therapy Treatment  Patient Details  Name: Renee Hood MRN: [id number] Date of Birth: 12-May-1975 Referring Provider (PT): Margarita Rana, MD   Encounter Date: 03/28/2018  PT End of Session - 03/28/18 1103    Visit Number  7    Number of Visits  13    Date for PT Re-Evaluation  04/08/18    Authorization Type  [employer]    PT Start Time  1019    PT Stop Time  1058    PT Time Calculation (min)  39 min    Activity Tolerance  Patient tolerated treatment well    Behavior During Therapy  [facility] for tasks assessed/performed       Past Medical History:  Diagnosis Date  . Anxiety   . Depression   . Diverticulitis   . Family history of adverse reaction to anesthesia    daughter  . No pertinent past medical history     Past Surgical History:  Procedure Laterality Date  . BREAST ENHANCEMENT SURGERY  2006  . CESAREAN SECTION     X2  . DILATION AND EVACUATION  12/28/2010   Procedure: DILATATION AND EVACUATION (D&E);  Surgeon: Tereso Newcomer, MD;  Location: [facility];  Service: Gynecology;  Laterality: N/A;  . dislocated hip  21 months   surgery to repair/tn  . EPIGASTRIC HERNIA REPAIR N/A 05/08/2016   Procedure: HERNIA REPAIR EPIGASTRIC ADULT;  Surgeon: Berna Bue, MD;  Location: [facility];  Service: General;  Laterality: N/A;    There were no vitals filed for this visit.  Subjective Assessment - 03/28/18 1018    Subjective  Reports that she had a rough day on Tuesday- had a lot of soreness. Notes that she did not take her usual Tylenol that day. Reports that the next day and today was fine.     Pertinent History  diverticulitis, dislocated hip    Diagnostic tests  01/11/18 L hip CT: moderate OA of L hip, No acute osseous injury of the left hip. 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  No/denies                       [facility] Adult PT Treatment/Exercise - 03/28/18 0001      Knee/Hip Exercises: Stretches   Lobbyist  Left;2 reps;30 seconds    Quad Stretch Limitations  prone strap    Hip Flexor Stretch  Left;2 reps;30 seconds    Hip Flexor Stretch Limitations  mod thomas stretch with strap to neutraL    Other Knee/Hip Stretches  L SKTC to tolerance 3x20"      Knee/Hip Exercises: Aerobic   Recumbent Bike  L3 x 3 min, L2 x 3 min      Knee/Hip Exercises: Standing   Other Standing Knee Exercises  standing on L LE on foam with anterior/posterior stepping on R LE x10    Other Standing Knee Exercises  sidestepping with yellow TB around toes 2x28ft      Knee/Hip Exercises: Supine   Bridges  Strengthening;Both;1 set;10 reps    Bridges Limitations  bridge + march    Other Supine Knee/Hip Exercises  straight leg  bridge x10 on peanut ball      Knee/Hip Exercises: Sidelying   Hip ABduction  Strengthening;Left;Limitations;1 set;10 reps;Right    Hip ABduction Limitations  1#; good tolerance      Knee/Hip Exercises: Prone   Other Prone Exercises  bird-dog x10 with manual cues to avoid hip rotation    Other Prone Exercises  plank on elbows/toes 2x20"                PT Short Term Goals - 03/22/18 [id number]      PT SHORT TERM GOAL #1   Title  Patient to be independent with initial HEP.    Time  3    Period  Weeks    Status  Achieved        PT Long Term Goals - 03/01/18 1153      PT LONG TERM GOAL #1   Title  Patient to be independent with advanced HEP.    Time  6    Period  Weeks    Status  On-going      PT LONG TERM GOAL #2   Title  Patient to demonstrate >=4+/5 strength in B LEs.    Time  6    Period  Weeks    Status  On-going      PT LONG TERM GOAL #3   Title  Patient to demonstrate L hip AROM symmetrical to oppisite LE and  non-painful.    Time  6    Period  Weeks    Status  On-going      PT LONG TERM GOAL #4   Title  Patient to demonstrate symmetrical step length, weight shift, and hio flexion with ambulation with LRAD.    Time  6    Period  Weeks    Status  On-going      PT LONG TERM GOAL #5   Title  Patient to return to 8,000 steps/day without pain limiting.     Time  6    Period  Weeks    Status  On-going            Plan - 03/28/18 1103    Clinical Impression Statement  Patient arrived to session with report of increased soreness in L LE on Tuesday, but full resolution the next day. Tolerated L LE SKTC stretch within limited range, noting discomfort but no pain. Progressed glute strengthening with marching bridge with slight hip drop on B sides demonstrated. Worked on dynamic standing balance and hip endurance of L LE with patient demonstrating some difficulty. Patient planning to return to work later this month, reporting that she would like to wrap up with PT within the coming visits. No complaints at end of session.     Clinical Impairments Affecting Rehab Potential  diverticulitis, dislocated hip    PT Treatment/Interventions  ADLs/Self Care Home Management;Cryotherapy;Electrical Stimulation;Functional mobility training;Stair training;Gait training;DME Instruction;Ultrasound;Moist Heat;Therapeutic activities;Therapeutic exercise;Balance training;Neuromuscular re-education;Patient/family education;Passive range of motion;Scar mobilization;Manual techniques;Dry needling;Energy conservation;Splinting;Taping;Vasopneumatic Device    PT Next Visit Plan  progress LE strengthening as tolerated    Consulted and Agree with Plan of Care  Patient       Patient will benefit from skilled therapeutic intervention in order to improve the following deficits and impairments:  Decreased range of motion, Difficulty walking, Decreased activity tolerance, Pain, Decreased balance, Impaired flexibility, Decreased scar  mobility, Postural dysfunction, Decreased strength, Increased edema, Improper body mechanics  Visit Diagnosis: Pain in left hip  Stiffness of left hip, not elsewhere classified  Muscle weakness (generalized)  Difficulty in walking, not elsewhere classified     Problem List Patient Active Problem List   Diagnosis Date Noted  . Incarcerated epigastric hernia 07/17/2012  . Abortion, missed 02/14/2011    Anette GuarneriYevgeniya Deiona Hooper, PT, DPT 03/28/18 11:47 AM   [facility] [address]  [address] [place], [place], [address] Phone: [phone]   Fax:  [phone]  Name: Renee Hood MRN: [id number] Date of Birth: 09-08-1975

## 2018-03-29 ENCOUNTER — Encounter: Payer: Managed Care, Other (non HMO) | Admitting: Physical Therapy

## 2018-04-01 ENCOUNTER — Encounter: Payer: Self-pay | Admitting: Physical Therapy

## 2018-04-01 ENCOUNTER — Ambulatory Visit: Payer: Managed Care, Other (non HMO) | Admitting: Physical Therapy

## 2018-04-01 DIAGNOSIS — M25652 Stiffness of left hip, not elsewhere classified: Secondary | ICD-10-CM

## 2018-04-01 DIAGNOSIS — M25552 Pain in left hip: Secondary | ICD-10-CM | POA: Diagnosis not present

## 2018-04-01 DIAGNOSIS — R262 Difficulty in walking, not elsewhere classified: Secondary | ICD-10-CM

## 2018-04-01 DIAGNOSIS — M6281 Muscle weakness (generalized): Secondary | ICD-10-CM

## 2018-04-01 NOTE — Therapy (Signed)
[facility] [address]  [address] [place], [place], [address] Phone: [phone]   Fax:  [phone]  Physical Therapy Treatment  Patient Details  Name: Renee Hood MRN: [id number] Date of Birth: October 11, 1975 Referring Provider (PT): Margarita Rana, MD   Encounter Date: 04/01/2018  PT End of Session - 04/01/18 1147    Visit Number  8    Number of Visits  13    Date for PT Re-Evaluation  04/08/18    Authorization Type  [employer]    PT Start Time  1101    PT Stop Time  1143    PT Time Calculation (min)  42 min    Activity Tolerance  Patient tolerated treatment well    Behavior During Therapy  [facility] for tasks assessed/performed       Past Medical History:  Diagnosis Date  . Anxiety   . Depression   . Diverticulitis   . Family history of adverse reaction to anesthesia    daughter  . No pertinent past medical history     Past Surgical History:  Procedure Laterality Date  . BREAST ENHANCEMENT SURGERY  2006  . CESAREAN SECTION     X2  . DILATION AND EVACUATION  12/28/2010   Procedure: DILATATION AND EVACUATION (D&E);  Surgeon: Tereso Newcomer, MD;  Location: [facility];  Service: Gynecology;  Laterality: N/A;  . dislocated hip  21 months   surgery to repair/tn  . EPIGASTRIC HERNIA REPAIR N/A 05/08/2016   Procedure: HERNIA REPAIR EPIGASTRIC ADULT;  Surgeon: Berna Bue, MD;  Location: [facility];  Service: General;  Laterality: N/A;    There were no vitals filed for this visit.  Subjective Assessment - 04/01/18 1100    Subjective  Reports that she walked 5,000 steps on Friday and was still feeling okay the next day. Notices that going down inclines is the hardest. No pain today, just stiffness. Requesting to wrap up with PT next session.     Pertinent History  diverticulitis, dislocated hip    Diagnostic tests  01/11/18 L hip CT: moderate OA of L hip, No acute osseous injury of the left hip. 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  No/denies                       [facility] Adult PT Treatment/Exercise - 04/01/18 0001      Knee/Hip Exercises: Aerobic   Nustep  L3 x LEs only      Knee/Hip Exercises: Standing   Hip Flexion  Stengthening;Right;Left;Knee bent;1 set;20 reps    Hip Flexion Limitations  resisted marching with yellow TB around toes    Hip ADduction  Strengthening;Right;Left;1 set;10 reps;Limitations    Hip ADduction Limitations  2 ski poles; red TB    Hip Abduction  Stengthening;Right;Left;1 set;10 reps;Knee straight    Abduction Limitations  2 ski poles; red TB    Other Standing Knee Exercises  R & L hip flexion with 2 ski poles and red TB x10 each LE with LE straight      Knee/Hip Exercises: Supine   Single Leg Bridge  Strengthening;Right;Left;1 set;10 reps   bridge + SLR on orange pball   Other Supine Knee/Hip Exercises  alternate resisted hip flexion on pball with red TB  2x10    Other Supine Knee/Hip Exercises  isometric hip flexion at 90/90 5x10"   cues to avoid valsalva     Knee/Hip Exercises: Sidelying   Clams  10x each side with red TB             PT Education - 04/01/18 1146    Education Details  collaborative update and consolidation of HEP with patient's input, administered red TB    Person(s) Educated  Patient    Methods  Explanation;Demonstration;Tactile cues;Verbal cues;Handout    Comprehension  Verbalized understanding;Returned demonstration       PT Short Term Goals - 03/22/18 8984      PT SHORT TERM GOAL #1   Title  Patient to be independent with initial HEP.    Time  3    Period  Weeks    Status  Achieved        PT Long Term Goals - 03/01/18 1153      PT LONG TERM GOAL #1   Title  Patient to be independent with advanced HEP.    Time  6    Period  Weeks    Status  On-going      PT LONG TERM  GOAL #2   Title  Patient to demonstrate >=4+/5 strength in B LEs.    Time  6    Period  Weeks    Status  On-going      PT LONG TERM GOAL #3   Title  Patient to demonstrate L hip AROM symmetrical to oppisite LE and non-painful.    Time  6    Period  Weeks    Status  On-going      PT LONG TERM GOAL #4   Title  Patient to demonstrate symmetrical step length, weight shift, and hio flexion with ambulation with LRAD.    Time  6    Period  Weeks    Status  On-going      PT LONG TERM GOAL #5   Title  Patient to return to 8,000 steps/day without pain limiting.     Time  6    Period  Weeks    Status  On-going            Plan - 04/01/18 1147    Clinical Impression Statement  Patient reporting that she was able to walk 5,000 steps on Friday with little soreness in L hip. Took time this session to provide patient with collaborative update and consolidation of HEP with patient's input, as she reports she will be ready to wrap up with PT next session. Worked on progressive LE strengthening this session, focusing on hip and core strength. Patient reporting fatigue after isometric hip flexion d/t abdominal muscle fatigue. Able to progress hip strengthening with banded 3 way hip, avoiding hip extension d/t surgical precautions. Patient without complaints at end of session. Plan to wrap up with PT next session per patient's request.     Clinical Impairments Affecting Rehab Potential  diverticulitis, dislocated hip    PT Treatment/Interventions  ADLs/Self Care Home Management;Cryotherapy;Electrical Stimulation;Functional mobility training;Stair training;Gait training;DME Instruction;Ultrasound;Moist Heat;Therapeutic activities;Therapeutic exercise;Balance training;Neuromuscular re-education;Patient/family education;Passive range of motion;Scar mobilization;Manual techniques;Dry needling;Energy conservation;Splinting;Taping;Vasopneumatic Device    PT Next Visit Plan  30 day hold or D/C next session     Consulted and Agree with Plan of Care  Patient       Patient will benefit from skilled therapeutic intervention in order to improve the following deficits and impairments:  Decreased range  of motion, Difficulty walking, Decreased activity tolerance, Pain, Decreased balance, Impaired flexibility, Decreased scar mobility, Postural dysfunction, Decreased strength, Increased edema, Improper body mechanics  Visit Diagnosis: Pain in left hip  Stiffness of left hip, not elsewhere classified  Muscle weakness (generalized)  Difficulty in walking, not elsewhere classified     Problem List Patient Active Problem List   Diagnosis Date Noted  . Incarcerated epigastric hernia 07/17/2012  . Abortion, missed 02/14/2011    Anette GuarneriYevgeniya Beatrice Sehgal, PT, DPT 04/01/18 11:52 AM   [facility] [address]  [address] [place], [place], [address] Phone: [phone]   Fax:  [phone]  Name: Renee Hood MRN: [id number] Date of Birth: [birth year]

## 2018-04-04 ENCOUNTER — Encounter: Payer: Self-pay | Admitting: Physical Therapy

## 2018-04-04 ENCOUNTER — Ambulatory Visit: Payer: Managed Care, Other (non HMO) | Admitting: Physical Therapy

## 2018-04-04 DIAGNOSIS — R262 Difficulty in walking, not elsewhere classified: Secondary | ICD-10-CM

## 2018-04-04 DIAGNOSIS — M25552 Pain in left hip: Secondary | ICD-10-CM

## 2018-04-04 DIAGNOSIS — M25652 Stiffness of left hip, not elsewhere classified: Secondary | ICD-10-CM

## 2018-04-04 DIAGNOSIS — M6281 Muscle weakness (generalized): Secondary | ICD-10-CM

## 2018-04-04 NOTE — Therapy (Addendum)
[facility] [address]  [place], [place], [address] Phone: [phone]   Fax:  [phone]  Physical Therapy Treatment  Patient Details  Name: Renee Hood MRN: [id number] Date of Birth: 31-May-1975 Referring Provider (PT): Edmonia Lynch, MD   Encounter Date: 04/04/2018  PT End of Session - 04/04/18 1152    Visit Number  9    Number of Visits  13    Date for PT Re-Evaluation  04/08/18    Authorization Type  [employer]    PT Start Time  1100    PT Stop Time  1144    PT Time Calculation (min)  44 min    Activity Tolerance  Patient tolerated treatment well;Patient limited by pain    Behavior During Therapy  [facility] for tasks assessed/performed       Past Medical History:  Diagnosis Date  . Anxiety   . Depression   . Diverticulitis   . Family history of adverse reaction to anesthesia    daughter  . No pertinent past medical history     Past Surgical History:  Procedure Laterality Date  . BREAST ENHANCEMENT SURGERY  2006  . CESAREAN SECTION     X2  . DILATION AND EVACUATION  12/28/2010   Procedure: DILATATION AND EVACUATION (D&E);  Surgeon: Osborne Oman, MD;  Location: [facility];  Service: Gynecology;  Laterality: N/A;  . dislocated hip  21 months   surgery to repair/tn  . EPIGASTRIC HERNIA REPAIR N/A 05/08/2016   Procedure: HERNIA REPAIR EPIGASTRIC ADULT;  Surgeon: Clovis Riley, MD;  Location: [facility];  Service: General;  Laterality: N/A;    There were no vitals filed for this visit.  Subjective Assessment - 04/04/18 1100    Subjective  Reports that she carried a laundry basket on Tuesday and believes that she has had anterior L hip soreness since then. Notes that she was not sore after last PT session. Reports that she still has difficulty with getting in and out of car as well as wobbling with walking.    Pertinent History  diverticulitis, dislocated hip    Diagnostic tests  01/11/18 L hip CT: moderate OA  of L hip, No acute osseous injury of the left hip. 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  Yes    Pain Score  4     Pain Location  Hip    Pain Orientation  Left;Anterior    Pain Descriptors / Indicators  Aching;Sore    Pain Type  Acute pain;Surgical pain         [facility] PT Assessment - 04/04/18 0001      Observation/Other Assessments   Focus on Therapeutic Outcomes (FOTO)   Hip: 51 (49% limited, 41% predicted)      AROM   Left Hip Flexion  120    Left Hip Internal Rotation   30    Left Hip ABduction  15   with AAROM from PT; reporting discomfort     Strength   Right Hip Flexion  5/5    Right Hip ABduction  4+/5    Right Hip ADduction  4+/5    Left Hip Flexion  4/5    Left Hip ABduction  4/5    Left Hip ADduction  4/5    Right  Knee Flexion  4+/5    Right Knee Extension  4+/5    Left Knee Flexion  4+/5    Left Knee Extension  4+/5    Right Ankle Dorsiflexion  4+/5    Right Ankle Plantar Flexion  4+/5    Left Ankle Dorsiflexion  4+/5    Left Ankle Plantar Flexion  4+/5                   [facility] Adult PT Treatment/Exercise - 04/04/18 0001      Ambulation/Gait   Ambulation Distance (Feet)  180 Feet    Assistive device  None    Gait Pattern  Step-through pattern;Decreased hip/knee flexion - left;Decreased weight shift to left   slight L circumduction and antalgia   Ambulation Surface  Level;Indoor    Gait velocity  slightly decreased    Gait Comments  patient with slightly increased L LE guarding and favoring d/t pain      Knee/Hip Exercises: Stretches   Control and instrumentation engineer reps;30 seconds    Quad Stretch Limitations  prone strap    Hip Flexor Stretch  Left;2 reps;30 seconds    Hip Flexor Stretch Limitations  mod thomas stretch to neutraL      Knee/Hip Exercises: Aerobic   Recumbent Bike  L2 x 6 min      Knee/Hip  Exercises: Seated   Other Seated Knee/Hip Exercises  sitting ab isometric 10x10"    Other Seated Knee/Hip Exercises         Knee/Hip Exercises: Supine   Other Supine Knee/Hip Exercises  alt deadbug with orange pball on belly x10      Knee/Hip Exercises: Prone   Other Prone Exercises  side plank lifts x10 each side   more difficulty on L side   Other Prone Exercises  plank on elbows/toes 2x20"       Manual Therapy   Manual Therapy  Soft tissue mobilization    Soft tissue mobilization  L proximal quad- patient reporting soreness in this area, but good tolerance             PT Education - 04/04/18 1151    Education Details  encouraged to maintain HEP regimen for continued progression of functional activity tolerance    Person(s) Educated  Patient    Methods  Explanation    Comprehension  Verbalized understanding       PT Short Term Goals - 04/04/18 1152      PT SHORT TERM GOAL #1   Title  Patient to be independent with initial HEP.    Time  3    Period  Weeks    Status  Achieved        PT Long Term Goals - 04/04/18 1153      PT LONG TERM GOAL #1   Title  Patient to be independent with advanced HEP.    Time  6    Period  Weeks    Status  Achieved      PT LONG TERM GOAL #2   Title  Patient to demonstrate >=4+/5 strength in B LEs.    Time  6    Period  Weeks    Status  Partially Met   improvements in B hip flexion, L hip abduction and adduction strength     PT LONG TERM GOAL #3   Title  Patient to demonstrate L hip AROM symmetrical to oppisite LE and non-painful.    Time  6  Period  Weeks    Status  Partially Met   still limited in hip abduction ROM, but good improvement in hip flexion     PT LONG TERM GOAL #4   Title  Patient to demonstrate symmetrical step length, weight shift, and hio flexion with ambulation with LRAD.    Time  6    Period  Weeks    Status  Partially Met   patient with slightly increased L LE guarding and favoring d/t pain with  ambulation without AD today; still showing slight antalgia     PT LONG TERM GOAL #5   Title  Patient to return to 8,000 steps/day without pain limiting.     Time  6    Period  Weeks    Status  Partially Met   reports that she has completed 5,000 steps without pain limiting           Plan - 04/04/18 1200    Clinical Impression Statement  Patient arrived to session with report of increased soreness in L anterior thigh since Tuesday- believes this is d/t carrying a laundry basket. Reports that she would like to transition to HEP today, however does note that she still struggles with a normal gait pattern and getting into her car. Patient has partially met all goals at this time. Has shown excellent improvements in B hip flexion, L hip abduction and adduction strength. Still limited in hip abduction ROM, but good improvement in hip flexion AROM. Patient with slightly increased guarding and favoring of L LE d/t pain with ambulation without AD today, and still showing slight antalgia throughout. Addressed L thigh soreness with STM to proximal quad with patient demonstrating good tolerance. Worked on light core strengthening and LE stretching. Encouraged patient to maintain HEP regimen for continued progression of functional activity tolerance. Patient reported understanding. Placing patient on 30 day hold at this time per her request.     Clinical Impairments Affecting Rehab Potential  diverticulitis, dislocated hip    PT Treatment/Interventions  ADLs/Self Care Home Management;Cryotherapy;Electrical Stimulation;Functional mobility training;Stair training;Gait training;DME Instruction;Ultrasound;Moist Heat;Therapeutic activities;Therapeutic exercise;Balance training;Neuromuscular re-education;Patient/family education;Passive range of motion;Scar mobilization;Manual techniques;Dry needling;Energy conservation;Splinting;Taping;Vasopneumatic Device    PT Next Visit Plan  30 day hold at this time     Consulted and Agree with Plan of Care  Patient       Patient will benefit from skilled therapeutic intervention in order to improve the following deficits and impairments:  Decreased range of motion, Difficulty walking, Decreased activity tolerance, Pain, Decreased balance, Impaired flexibility, Decreased scar mobility, Postural dysfunction, Decreased strength, Increased edema, Improper body mechanics  Visit Diagnosis: Pain in left hip  Stiffness of left hip, not elsewhere classified  Muscle weakness (generalized)  Difficulty in walking, not elsewhere classified     Problem List Patient Active Problem List   Diagnosis Date Noted  . Incarcerated epigastric hernia 07/17/2012  . Abortion, missed 02/14/2011    Janene Harvey, PT, DPT 04/04/18 12:09 PM   [facility] [address]  [place], [place], [address] Phone: [phone]   Fax:  [phone]  Name: DAENERYS BUTTRAM MRN: [id number] Date of Birth: 08-24-1975  PHYSICAL THERAPY DISCHARGE SUMMARY  Visits from Start of Care: 9  Current functional level related to goals / functional outcomes: See above clinical impression; patient pleased with progress and did not return after being placed on 30 day hold   Remaining deficits: Decreased hip ROM, decreased LE strength, gait deviations  Education / Equipment: HEP  Plan: Patient agrees to discharge.  Patient goals were partially met. Patient is being discharged due to being pleased with the current functional level.  ?????     Janene Harvey, PT, DPT 05/09/18 2:35 PM

## 2019-10-27 DIAGNOSIS — Z20822 Contact with and (suspected) exposure to covid-19: Secondary | ICD-10-CM | POA: Diagnosis not present

## 2020-06-15 DIAGNOSIS — Z20822 Contact with and (suspected) exposure to covid-19: Secondary | ICD-10-CM | POA: Diagnosis not present

## 2021-05-03 DIAGNOSIS — Z8669 Personal history of other diseases of the nervous system and sense organs: Secondary | ICD-10-CM | POA: Diagnosis not present

## 2021-05-03 DIAGNOSIS — R7989 Other specified abnormal findings of blood chemistry: Secondary | ICD-10-CM | POA: Diagnosis not present

## 2021-05-03 DIAGNOSIS — R232 Flushing: Secondary | ICD-10-CM | POA: Diagnosis not present

## 2021-05-03 DIAGNOSIS — E559 Vitamin D deficiency, unspecified: Secondary | ICD-10-CM | POA: Diagnosis not present

## 2021-05-03 DIAGNOSIS — N951 Menopausal and female climacteric states: Secondary | ICD-10-CM | POA: Diagnosis not present

## 2021-06-20 ENCOUNTER — Other Ambulatory Visit: Payer: Self-pay | Admitting: Family Medicine

## 2021-06-20 DIAGNOSIS — Z1231 Encounter for screening mammogram for malignant neoplasm of breast: Secondary | ICD-10-CM

## 2021-08-05 DIAGNOSIS — R079 Chest pain, unspecified: Secondary | ICD-10-CM | POA: Diagnosis not present

## 2021-08-05 DIAGNOSIS — Z87891 Personal history of nicotine dependence: Secondary | ICD-10-CM | POA: Diagnosis not present

## 2021-08-05 DIAGNOSIS — R091 Pleurisy: Secondary | ICD-10-CM | POA: Diagnosis not present

## 2021-08-05 DIAGNOSIS — J90 Pleural effusion, not elsewhere classified: Secondary | ICD-10-CM | POA: Diagnosis not present

## 2021-08-05 DIAGNOSIS — M542 Cervicalgia: Secondary | ICD-10-CM | POA: Diagnosis not present

## 2021-08-05 DIAGNOSIS — J918 Pleural effusion in other conditions classified elsewhere: Secondary | ICD-10-CM | POA: Diagnosis not present

## 2021-08-05 DIAGNOSIS — R0600 Dyspnea, unspecified: Secondary | ICD-10-CM | POA: Diagnosis not present

## 2021-08-05 DIAGNOSIS — R0602 Shortness of breath: Secondary | ICD-10-CM | POA: Diagnosis not present

## 2021-08-05 DIAGNOSIS — R9431 Abnormal electrocardiogram [ECG] [EKG]: Secondary | ICD-10-CM | POA: Diagnosis not present

## 2021-08-05 DIAGNOSIS — J9811 Atelectasis: Secondary | ICD-10-CM | POA: Diagnosis not present

## 2021-08-05 DIAGNOSIS — R918 Other nonspecific abnormal finding of lung field: Secondary | ICD-10-CM | POA: Diagnosis not present

## 2021-08-05 DIAGNOSIS — R0781 Pleurodynia: Secondary | ICD-10-CM | POA: Diagnosis not present

## 2021-08-05 DIAGNOSIS — Z8616 Personal history of COVID-19: Secondary | ICD-10-CM | POA: Diagnosis not present

## 2021-08-17 DIAGNOSIS — R091 Pleurisy: Secondary | ICD-10-CM | POA: Diagnosis not present

## 2021-08-17 DIAGNOSIS — R350 Frequency of micturition: Secondary | ICD-10-CM | POA: Diagnosis not present

## 2021-08-17 DIAGNOSIS — J189 Pneumonia, unspecified organism: Secondary | ICD-10-CM | POA: Diagnosis not present

## 2021-08-26 DIAGNOSIS — J189 Pneumonia, unspecified organism: Secondary | ICD-10-CM | POA: Diagnosis not present

## 2021-10-26 ENCOUNTER — Ambulatory Visit
Admission: RE | Admit: 2021-10-26 | Discharge: 2021-10-26 | Disposition: A | Discharge status: Home / Self Care | Payer: BC Managed Care – PPO | Source: Ambulatory Visit | Attending: Family Medicine | Admitting: Family Medicine

## 2021-10-26 DIAGNOSIS — Z13220 Encounter for screening for lipoid disorders: Secondary | ICD-10-CM | POA: Diagnosis not present

## 2021-10-26 DIAGNOSIS — Z131 Encounter for screening for diabetes mellitus: Secondary | ICD-10-CM | POA: Diagnosis not present

## 2021-10-26 DIAGNOSIS — Z130 Encounter for screening for diseases of the blood and blood-forming organs and certain disorders involving the immune mechanism: Secondary | ICD-10-CM | POA: Diagnosis not present

## 2021-10-26 DIAGNOSIS — Z1231 Encounter for screening mammogram for malignant neoplasm of breast: Secondary | ICD-10-CM | POA: Diagnosis not present

## 2021-10-26 DIAGNOSIS — R7989 Other specified abnormal findings of blood chemistry: Secondary | ICD-10-CM | POA: Diagnosis not present

## 2021-10-26 DIAGNOSIS — Z0000 Encounter for general adult medical examination without abnormal findings: Secondary | ICD-10-CM | POA: Diagnosis not present

## 2021-10-27 ENCOUNTER — Ambulatory Visit: Payer: Managed Care, Other (non HMO)

## 2022-01-18 DIAGNOSIS — Z1211 Encounter for screening for malignant neoplasm of colon: Secondary | ICD-10-CM | POA: Diagnosis not present

## 2022-06-06 DIAGNOSIS — E785 Hyperlipidemia, unspecified: Secondary | ICD-10-CM | POA: Diagnosis not present

## 2022-09-25 DIAGNOSIS — E785 Hyperlipidemia, unspecified: Secondary | ICD-10-CM | POA: Diagnosis not present

## 2022-11-23 DIAGNOSIS — K648 Other hemorrhoids: Secondary | ICD-10-CM | POA: Diagnosis not present

## 2022-11-23 DIAGNOSIS — Z1211 Encounter for screening for malignant neoplasm of colon: Secondary | ICD-10-CM | POA: Diagnosis not present

## 2022-11-23 DIAGNOSIS — K5730 Diverticulosis of large intestine without perforation or abscess without bleeding: Secondary | ICD-10-CM | POA: Diagnosis not present

## 2023-04-25 DIAGNOSIS — Z6826 Body mass index (BMI) 26.0-26.9, adult: Secondary | ICD-10-CM | POA: Diagnosis not present

## 2023-04-25 DIAGNOSIS — N924 Excessive bleeding in the premenopausal period: Secondary | ICD-10-CM | POA: Diagnosis not present

## 2023-05-14 DIAGNOSIS — D259 Leiomyoma of uterus, unspecified: Secondary | ICD-10-CM | POA: Diagnosis not present

## 2023-05-14 DIAGNOSIS — N924 Excessive bleeding in the premenopausal period: Secondary | ICD-10-CM | POA: Diagnosis not present

## 2023-05-28 DIAGNOSIS — Z1331 Encounter for screening for depression: Secondary | ICD-10-CM | POA: Diagnosis not present

## 2023-05-28 DIAGNOSIS — N951 Menopausal and female climacteric states: Secondary | ICD-10-CM | POA: Diagnosis not present

## 2023-05-28 DIAGNOSIS — E785 Hyperlipidemia, unspecified: Secondary | ICD-10-CM | POA: Diagnosis not present

## 2023-05-28 DIAGNOSIS — Z1159 Encounter for screening for other viral diseases: Secondary | ICD-10-CM | POA: Diagnosis not present

## 2023-05-28 DIAGNOSIS — Z0000 Encounter for general adult medical examination without abnormal findings: Secondary | ICD-10-CM | POA: Diagnosis not present

## 2023-05-29 DIAGNOSIS — N951 Menopausal and female climacteric states: Secondary | ICD-10-CM | POA: Diagnosis not present

## 2023-08-31 DIAGNOSIS — F39 Unspecified mood [affective] disorder: Secondary | ICD-10-CM | POA: Diagnosis not present

## 2023-08-31 DIAGNOSIS — R6882 Decreased libido: Secondary | ICD-10-CM | POA: Diagnosis not present

## 2023-08-31 DIAGNOSIS — Z0000 Encounter for general adult medical examination without abnormal findings: Secondary | ICD-10-CM | POA: Diagnosis not present

## 2023-08-31 DIAGNOSIS — N951 Menopausal and female climacteric states: Secondary | ICD-10-CM | POA: Diagnosis not present

## 2023-09-24 DIAGNOSIS — Z1329 Encounter for screening for other suspected endocrine disorder: Secondary | ICD-10-CM | POA: Diagnosis not present

## 2023-09-24 DIAGNOSIS — N951 Menopausal and female climacteric states: Secondary | ICD-10-CM | POA: Diagnosis not present

## 2023-10-01 DIAGNOSIS — E063 Autoimmune thyroiditis: Secondary | ICD-10-CM | POA: Diagnosis not present

## 2023-10-17 ENCOUNTER — Other Ambulatory Visit: Payer: Self-pay | Admitting: Family Medicine

## 2023-10-17 DIAGNOSIS — Z1231 Encounter for screening mammogram for malignant neoplasm of breast: Secondary | ICD-10-CM

## 2023-11-08 ENCOUNTER — Ambulatory Visit
Admission: RE | Admit: 2023-11-08 | Discharge: 2023-11-08 | Disposition: A | Discharge status: Home / Self Care | Source: Ambulatory Visit | Attending: Family Medicine | Admitting: Family Medicine

## 2023-11-08 DIAGNOSIS — Z1231 Encounter for screening mammogram for malignant neoplasm of breast: Secondary | ICD-10-CM
